# Patient Record
Sex: Male | Born: 1960
Health system: Southern US, Community
[De-identification: ages and names within clinical notes are randomized; demographics above are authoritative.]

## PROBLEM LIST (undated history)

## (undated) DIAGNOSIS — E7800 Pure hypercholesterolemia, unspecified: Secondary | ICD-10-CM

## (undated) DIAGNOSIS — T8484XA Pain due to internal orthopedic prosthetic devices, implants and grafts, initial encounter: Secondary | ICD-10-CM

## (undated) DIAGNOSIS — I10 Essential (primary) hypertension: Secondary | ICD-10-CM

## (undated) DIAGNOSIS — G4730 Sleep apnea, unspecified: Secondary | ICD-10-CM

## (undated) HISTORY — PX: VASECTOMY: SHX75

## (undated) HISTORY — DX: Sleep apnea, unspecified: G47.30

## (undated) HISTORY — PX: ADENOIDECTOMY: SUR15

## (undated) HISTORY — DX: Essential (primary) hypertension: I10

---

## 2000-06-12 ENCOUNTER — Emergency Department (HOSPITAL_COMMUNITY): Admission: EM | Admit: 2000-06-12 | Discharge: 2000-06-12 | Payer: Self-pay | Admitting: Emergency Medicine

## 2014-09-23 ENCOUNTER — Other Ambulatory Visit: Payer: Self-pay | Admitting: Otolaryngology

## 2014-09-23 DIAGNOSIS — R220 Localized swelling, mass and lump, head: Secondary | ICD-10-CM

## 2014-09-23 DIAGNOSIS — R221 Localized swelling, mass and lump, neck: Secondary | ICD-10-CM

## 2014-09-23 DIAGNOSIS — K1121 Acute sialoadenitis: Secondary | ICD-10-CM

## 2014-10-01 ENCOUNTER — Ambulatory Visit
Admission: RE | Admit: 2014-10-01 | Discharge: 2014-10-01 | Disposition: A | Discharge status: Home / Self Care | Payer: BLUE CROSS/BLUE SHIELD | Source: Ambulatory Visit | Attending: Otolaryngology | Admitting: Otolaryngology

## 2014-10-01 DIAGNOSIS — K1121 Acute sialoadenitis: Secondary | ICD-10-CM

## 2014-10-01 DIAGNOSIS — R220 Localized swelling, mass and lump, head: Secondary | ICD-10-CM

## 2014-10-01 DIAGNOSIS — R221 Localized swelling, mass and lump, neck: Secondary | ICD-10-CM

## 2016-03-06 IMAGING — US US SOFT TISSUE HEAD/NECK
1 series · 14 of 20 positions shown · non-contrast
Comparison: None.

CLINICAL DATA: Left facial swelling 2 weeks ago

EXAM:
ULTRASOUND OF HEAD/NECK SOFT TISSUES
TECHNIQUE: Ultrasound examination of the head and neck soft tissues was
performed in the area of clinical concern.

[Series 1: us soft tissue head/neck · 0.06mm/px · 14 of 20 slices shown]
[im 1/20]
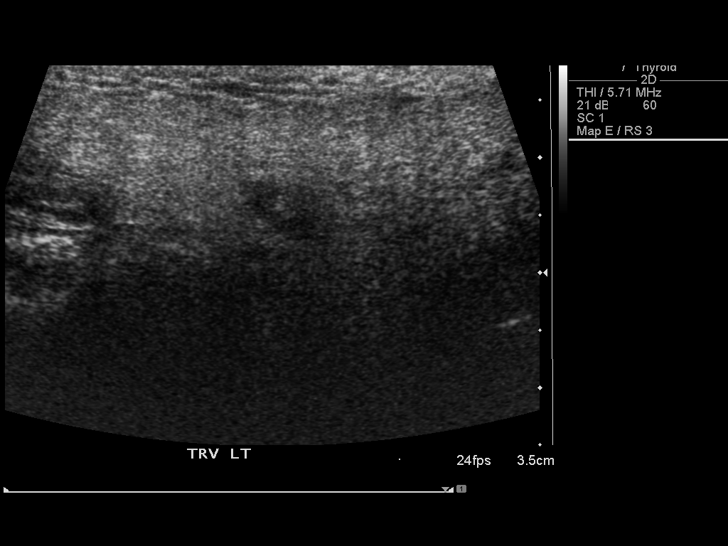
[im 3/20]
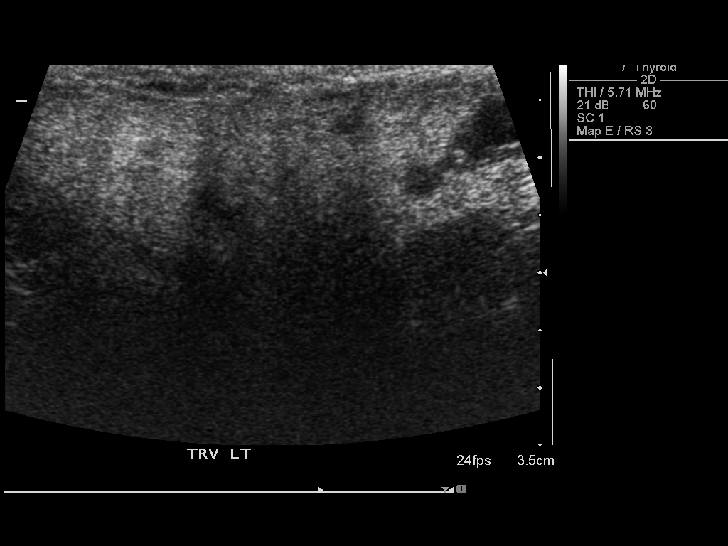
[im 4/20]
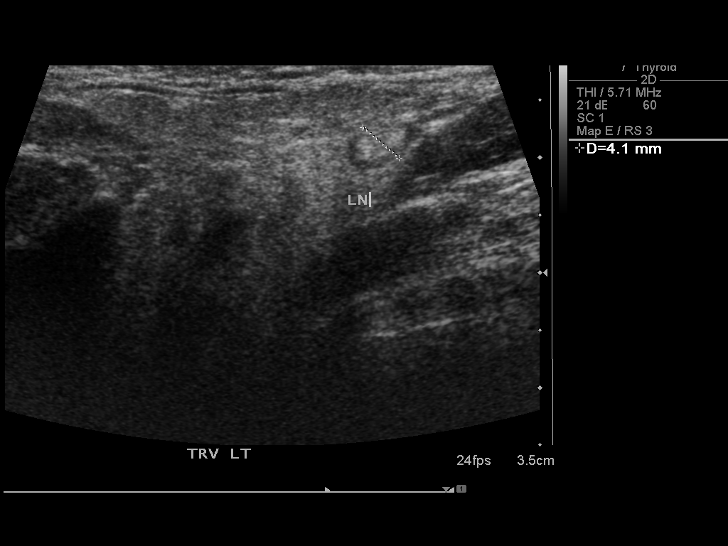
[im 6/20]
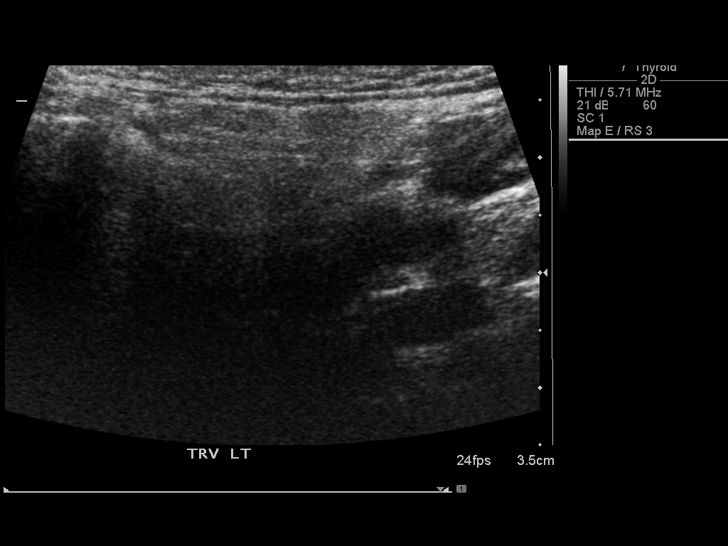
[im 7/20]
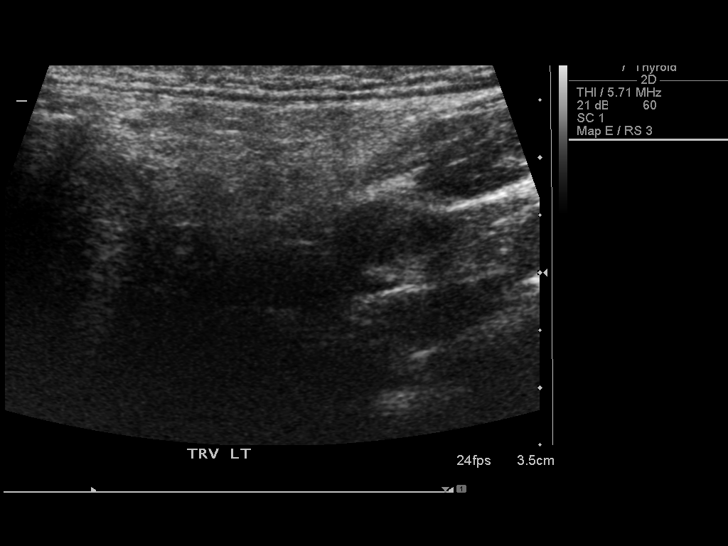
[im 8/20]
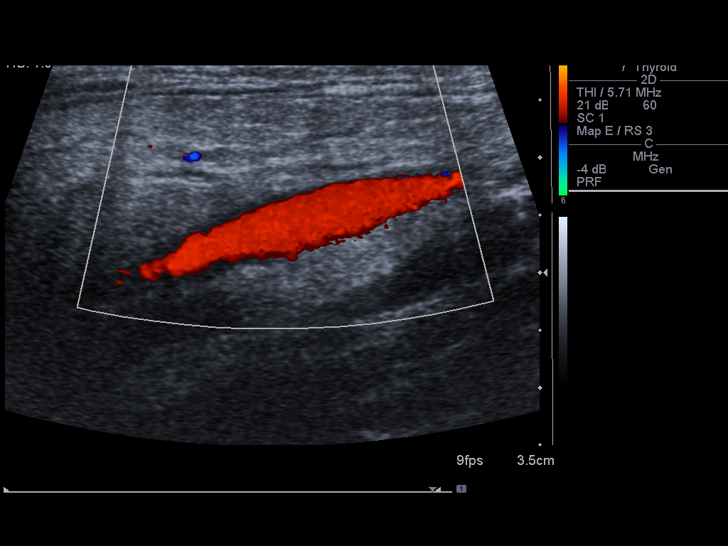
[im 10/20]
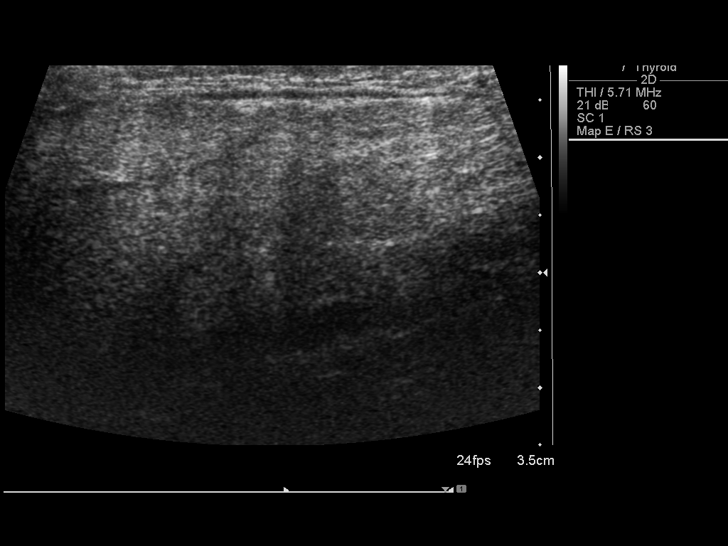
[im 11/20]
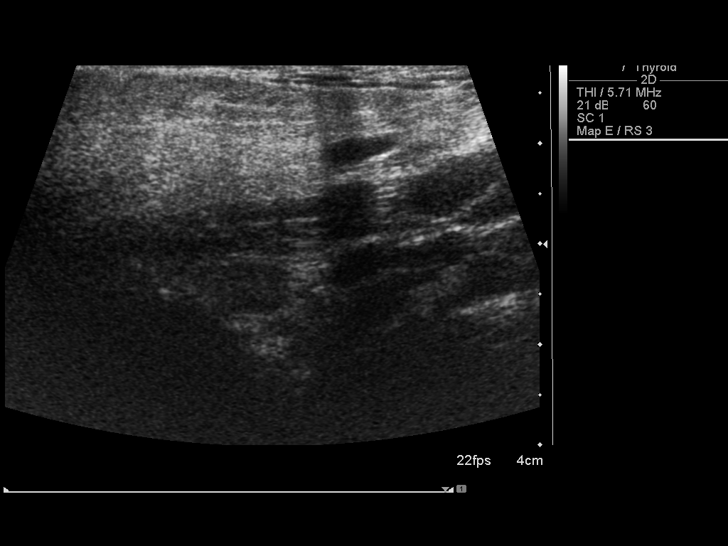
[im 13/20]
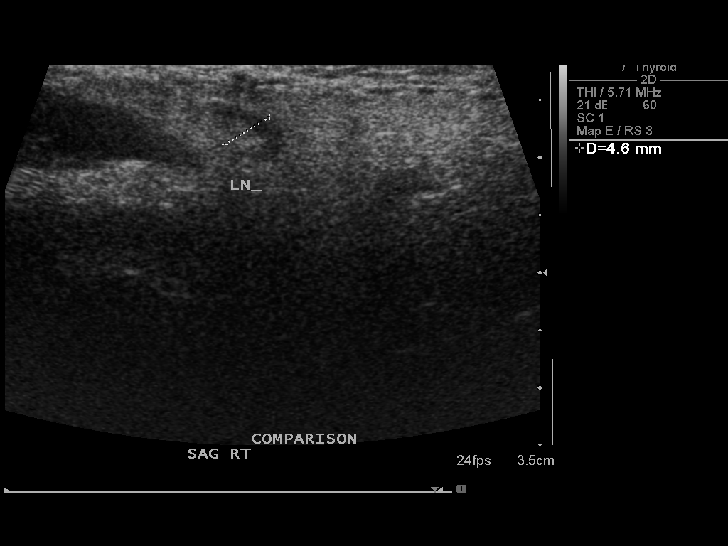
[im 14/20]
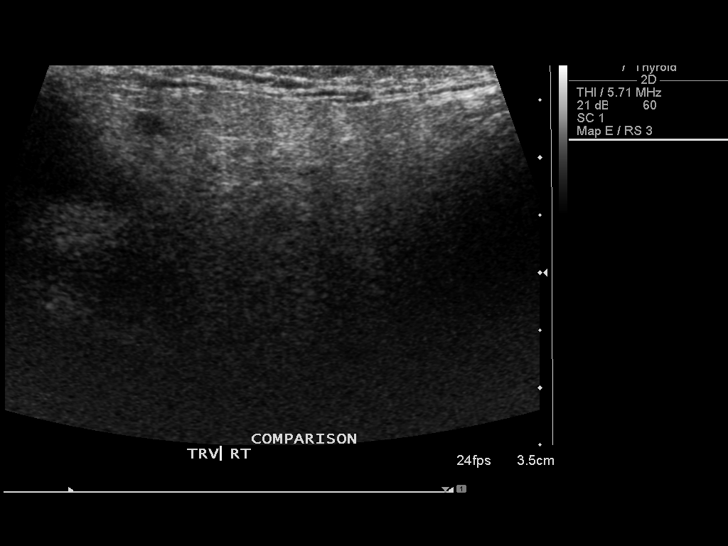
[im 16/20]
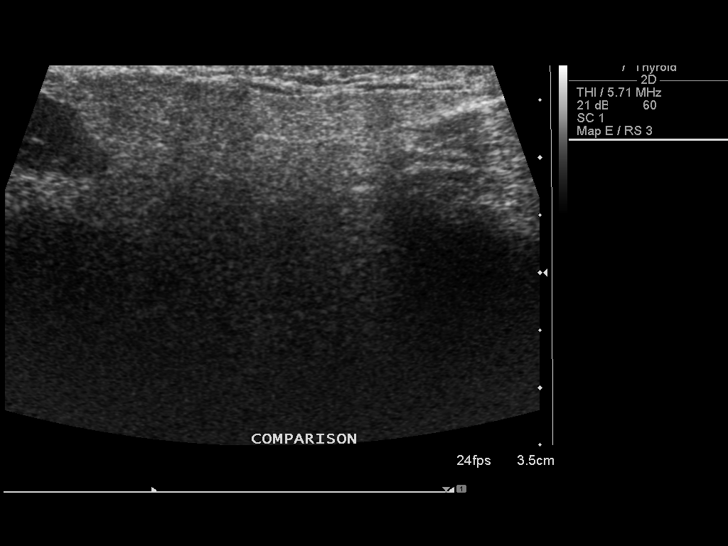
[im 17/20]
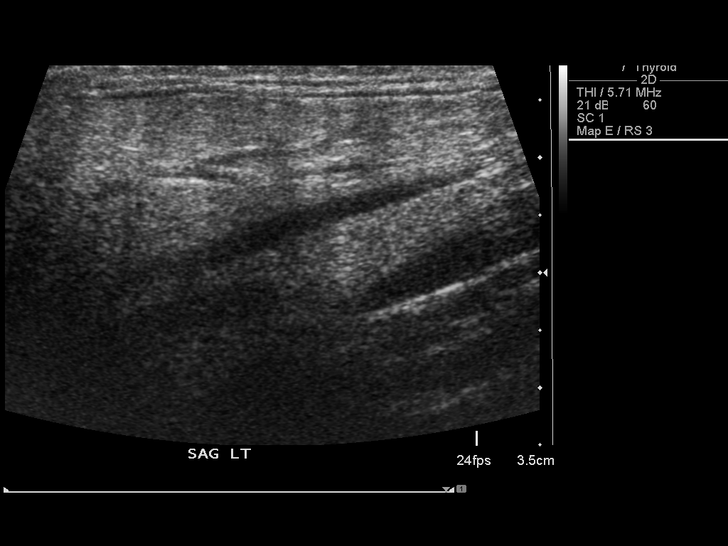
[im 18/20]
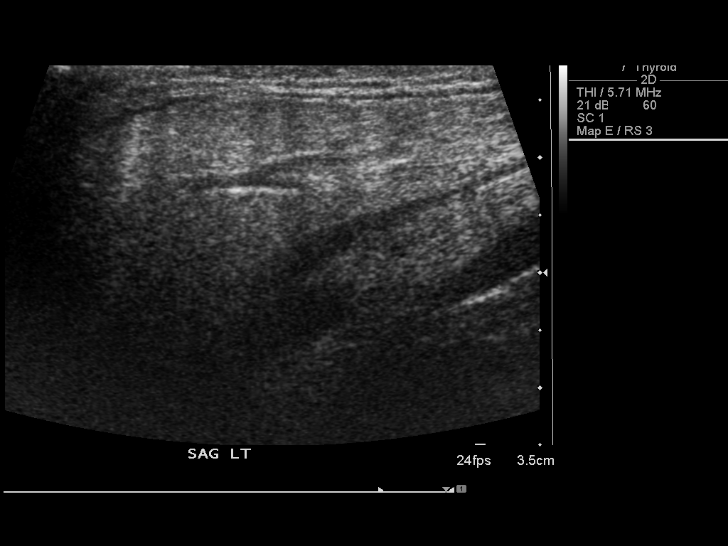
[im 20/20]
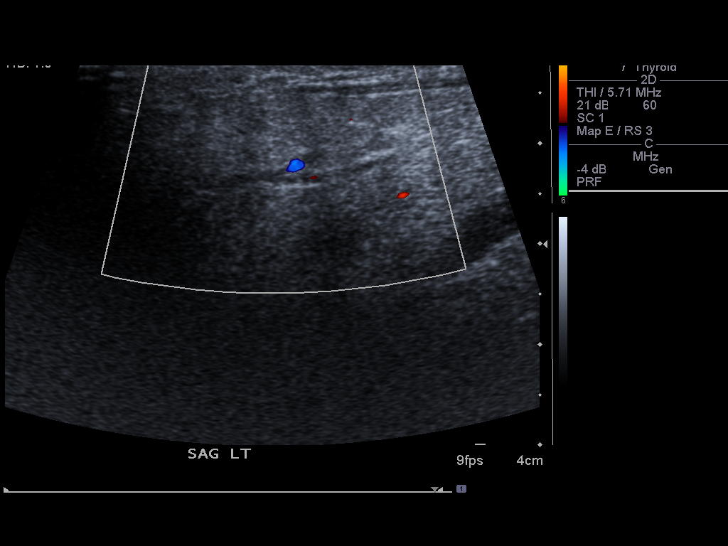

[14 of 20 positions shown; findings below may reference images not displayed]

FINDINGS: Parotid glands are unremarkable. No cyst, mass, or shadowing
calcification. No regional adenopathy identified.
IMPRESSION: 1. Negative

## 2016-03-28 ENCOUNTER — Emergency Department (HOSPITAL_COMMUNITY)
Admission: EM | Admit: 2016-03-28 | Discharge: 2016-03-28 | Disposition: A | Discharge status: Home / Self Care | Payer: BLUE CROSS/BLUE SHIELD | Attending: Emergency Medicine | Admitting: Emergency Medicine

## 2016-03-28 ENCOUNTER — Emergency Department (HOSPITAL_COMMUNITY): Payer: BLUE CROSS/BLUE SHIELD

## 2016-03-28 ENCOUNTER — Encounter (HOSPITAL_COMMUNITY): Payer: Self-pay | Admitting: Emergency Medicine

## 2016-03-28 DIAGNOSIS — S82851A Displaced trimalleolar fracture of right lower leg, initial encounter for closed fracture: Secondary | ICD-10-CM | POA: Diagnosis not present

## 2016-03-28 DIAGNOSIS — Y929 Unspecified place or not applicable: Secondary | ICD-10-CM | POA: Diagnosis not present

## 2016-03-28 DIAGNOSIS — S9304XA Dislocation of right ankle joint, initial encounter: Secondary | ICD-10-CM | POA: Diagnosis not present

## 2016-03-28 DIAGNOSIS — Y999 Unspecified external cause status: Secondary | ICD-10-CM | POA: Insufficient documentation

## 2016-03-28 DIAGNOSIS — Y9366 Activity, soccer: Secondary | ICD-10-CM | POA: Insufficient documentation

## 2016-03-28 DIAGNOSIS — S99911A Unspecified injury of right ankle, initial encounter: Secondary | ICD-10-CM | POA: Diagnosis present

## 2016-03-28 DIAGNOSIS — X501XXA Overexertion from prolonged static or awkward postures, initial encounter: Secondary | ICD-10-CM | POA: Diagnosis not present

## 2016-03-28 HISTORY — DX: Pure hypercholesterolemia, unspecified: E78.00

## 2016-03-28 MED ORDER — PROPOFOL 10 MG/ML IV BOLUS
INTRAVENOUS | Status: AC | PRN
  Administered 2016-03-28: 20 mg via INTRAVENOUS
  Administered 2016-03-28: 40 mg via INTRAVENOUS
  Administered 2016-03-28: 20 mg via INTRAVENOUS

## 2016-03-28 MED ORDER — HYDROCODONE-ACETAMINOPHEN 5-325 MG PO TABS: 2.0000 | ORAL_TABLET | Freq: Every evening | ORAL | 0 refills | Status: DC | PRN

## 2016-03-28 MED ORDER — PROPOFOL 10 MG/ML IV BOLUS
0.5000 mg/kg | Freq: Once | INTRAVENOUS | Status: DC
  Filled 2016-03-28: qty 20

## 2016-03-28 MED ORDER — KETOROLAC TROMETHAMINE 15 MG/ML IJ SOLN
15.0000 mg | Freq: Once | INTRAMUSCULAR | Status: AC
  Administered 2016-03-28: 15 mg via INTRAVENOUS
  Filled 2016-03-28: qty 1

## 2016-03-28 NOTE — ED Notes (Signed)
Patient being transported to x-ray now.

## 2016-03-28 NOTE — Sedation Documentation (Signed)
Dr. Shon BatonBrooks successfully reduced patient's R ankle. Patient remains alert. Ortho tech splinting R leg now.

## 2016-03-28 NOTE — ED Notes (Signed)
Portable at the bedside.  

## 2016-03-28 NOTE — ED Notes (Signed)
PA-C at bedside 

## 2016-03-28 NOTE — Progress Notes (Signed)
Orthopedic Tech Progress Note Patient Details:  Vanessa BarbaraJonathan Moravek 23-Feb-1961 [id number]  Ortho Devices Type of Ortho Device: Ace wrap, Crutches, Post (short leg) splint, Stirrup splint Ortho Device/Splint Location: RLE Ortho Device/Splint Interventions: Ordered, Application   Jennye MoccasinHughes, Teodor Prater Craig 03/28/2016, 10:12 PM

## 2016-03-28 NOTE — ED Notes (Signed)
Patient A&O x 4, aldrete score 10. Patient given instructions on use of crutches/care of splint, follow-up instructions with orthopedic surgeon, and prescription medication. Patient verbalized understanding of discharge instructions and denies any further needs or questions at this time. VS stable. Escorted patient to ED entrance in wheelchair.

## 2016-03-28 NOTE — Sedation Documentation (Addendum)
CO2 and oxygen hooked up to patient. Ambu bag and suction set up at the bedside.

## 2016-03-28 NOTE — ED Triage Notes (Signed)
PER [facility]: Patient to ED from soccer field c/o ankle pain. Per EMS, patient went up for a header and landed on his R ankle wrong, heard it snap. EMS has splint on it at this time. CMS intact distal to injury. Ankle is forward and to the right. EMS VS: 157/92, HR 90, RR 18. Patient A&O x 4, no other injuries. Is refusing pain medication because he "just doesn't want any."

## 2016-03-28 NOTE — ED Provider Notes (Addendum)
[facility]-EMERGENCY DEPT Provider Note   CSN: [id number] Arrival date & time: 03/28/16  2004  First Provider Contact:  None       History   Chief Complaint Chief Complaint  Patient presents with  . Ankle Pain    HPI Matthew Smith is a 55 y.o. male.  HPI   Patient is a 55 year old male who presents the ED with right ankle pain and deformity roughly 30 minutes prior to arrival. Patient states his play soccer and was hit from the side and landed on his ankle. He said he heard a crunch and experienced sharp pain. His pain currently is nonradiating, 5/10. He is not taking anything for pain. He denies numbness/tingling, nausea, vomiting, chest pain, shortness breath.  Past Medical History:  Diagnosis Date  . Hypercholesterolemia     There are no active problems to display for this patient.   Past Surgical History:  Procedure Laterality Date  . VASECTOMY         Home Medications    Prior to Admission medications   Medication Sig Start Date End Date Taking? Authorizing Provider  HYDROcodone-acetaminophen (NORCO/VICODIN) 5-325 MG tablet Take 2 tablets by mouth at bedtime as needed. 03/28/16   Jerre Simon, PA    Family History Family History  Problem Relation Age of Onset  . Cancer Mother   . Heart attack Father     Social History Social History  Substance Use Topics  . Smoking status: Never Smoker  . Smokeless tobacco: Never Used  . Alcohol use Yes     Comment: daily, 2-3 drinks a day, "not to get drunk"     Allergies   Review of patient's allergies indicates no known allergies.   Review of Systems Review of Systems  Constitutional: Negative for fever.  Respiratory: Negative for shortness of breath.   Cardiovascular: Negative for chest pain.  Gastrointestinal: Negative for nausea and vomiting.  Musculoskeletal: Positive for arthralgias and joint swelling.  Skin: Negative for rash and wound.     Physical Exam Updated Vital Signs BP 159/93   Pulse  63   Temp 99 F (37.2 C) (Oral)   Resp 18   Ht 5\' 11"  (1.803 m)   Wt 81.6 kg   SpO2 97%   BMI 25.10 kg/m   Physical Exam  Constitutional: He appears well-developed and well-nourished. No distress.  HENT:  Head: Normocephalic and atraumatic.  Eyes: Conjunctivae are normal.  Cardiovascular: Normal rate, regular rhythm and normal heart sounds.  Exam reveals no gallop and no friction rub.   No murmur heard. Pulmonary/Chest: Effort normal. No respiratory distress.  Musculoskeletal: Normal range of motion. He exhibits tenderness and deformity.  Examination of right lower extremity reveals obvious deformity of the anterior ankle, foot is pointed medially, mild ecchymosis and mild edema noted, full range motion of the knee and toes, no range of motion of ankle, deformity TTP, patient's neurovascularly intact distally  Neurological: He is alert. Coordination normal.  Skin: Skin is warm and dry. He is not diaphoretic.  Psychiatric: He has a normal mood and affect. His behavior is normal.  Nursing note and vitals reviewed.    ED Treatments / Results  Labs (all labs ordered are listed, but only abnormal results are displayed) Labs Reviewed - No data to display  EKG  EKG Interpretation None       Radiology Dg Ankle 2 Views Right  Result Date: 03/28/2016 CLINICAL DATA:  Fracture, postreduction. EXAM: RIGHT ANKLE - 2 VIEW COMPARISON:  Pre  reduction radiographs earlier this day at 2039 hour FINDINGS: Improved alignment of the distal fibular fracture in tiny avulsion from the medial malleolus. Probable posterior tibial tubercle fracture, minimally displaced. There is persistent widening of the medial and lateral clear space. Overlying cast material in place. IMPRESSION: Improved alignment of the ankle fractures post reduction. This is likely a trimalleolar fracture. Electronically Signed   By: Rubye Oaks M.D.   On: 03/28/2016 22:58   Dg Ankle Complete Right  Result Date:  03/28/2016 CLINICAL DATA:  Status post fall while playing soccer, with right ankle deformity. Initial encounter. EXAM: RIGHT ANKLE - COMPLETE 3+ VIEW COMPARISON:  None. FINDINGS: There is a comminuted fracture of the distal fibula, with shortening and superior and lateral displacement. There is also a small avulsion fracture from the distal tip of the tibia. There is associated lateral dislocation of the talus. There may be a posterior malleolar fragment, though this is difficult to fully assess. Surrounding soft tissue swelling is noted. No additional fractures are seen. IMPRESSION: 1. Comminuted fracture of the distal fibula, with shortening and superior and lateral displacement. Small avulsion fracture from the distal tip of the tibia. 2. Associated lateral dislocation of the talus. 3. Possible posterior malleolar fragment, difficult to fully assess. Electronically Signed   By: Roanna Raider M.D.   On: 03/28/2016 21:31    Procedures Procedures (including critical care time)  Medications Ordered in ED Medications  propofol (DIPRIVAN) 10 mg/mL bolus/IV push 40.8 mg (not administered)  ketorolac (TORADOL) 15 MG/ML injection 15 mg (15 mg Intravenous Given 03/28/16 2129)  propofol (DIPRIVAN) 10 mg/mL bolus/IV push (20 mg Intravenous Given 03/28/16 2159)     Initial Impression / Assessment and Plan / ED Course  I have reviewed the triage vital signs and the nursing notes.  Pertinent labs & imaging results that were available during my care of the patient were reviewed by me and considered in my medical decision making (see chart for details).  Clinical Course   Pt with trimalleolar fracture dislocation. Dr. Shon Baton was consulted.   Dr. Judd Lien preformed conscious sedation for Dr. Shon Baton to reduce ankle dislocation. Pt was splinted and given toradol IV for pain.  Pt will be d/c with pain medication and f/u with Dr. Victorino Dike this week. Discussed no weight bearing, ice and elevation. Discussed strict return  precautions. Pt expressed understanding to the discharge instructions.  Pt case discussed and pt seen by Dr. Judd Lien who agrees with the above plan.    Final Clinical Impressions(s) / ED Diagnoses   Final diagnoses:  Trimalleolar fracture of ankle, closed, right, initial encounter  Ankle dislocation, right, initial encounter    New Prescriptions Discharge Medication List as of 03/28/2016 10:33 PM    START taking these medications   Details  HYDROcodone-acetaminophen (NORCO/VICODIN) 5-325 MG tablet Take 2 tablets by mouth at bedtime as needed., Starting Sun 03/28/2016, Print         Joyce Copa [place], PA 03/28/16 2337    Geoffery Lyons, MD 03/29/16 [phone]   Preprocedure  Pre-anesthesia/induction confirmation of laterality/correct procedure site including "time-out."  Provider confirms review of the nurses' note, allergies, medications, pertinent labs, PMH, pre-induction vital signs, pulse oximetry, pain level, and ECG (as applicable), and patient condition satisfactory for commencing with order for sedation and procedure.  Patient was given appropriate doses of propofol to achieve adequate sedation. The ankle was reduced by Dr. Shon Baton and splinted. Patient tolerated this well with no desaturations and an intact respiratory drive. He will  copy with no complications.    Geoffery Lyonsouglas Lenoria Narine, MD 04/05/16 [phone]

## 2016-03-28 NOTE — Consult Note (Signed)
Romie JumperBarden, Lucy P, PA-C Chief Complaint: S/p fall while playing soccer History: Patient is a 55 year old male who presents the ED with right ankle pain and deformity roughly 30 minutes prior to arrival. Patient states his play soccer and was hit from the side and landed on his ankle. He said he heard a crunch and experienced sharp pain. His pain currently is nonradiating, 5/10. He is not taking anything for pain. He denies numbness/tingling, nausea, vomiting, chest pain, shortness breath.  Past Medical History:  Diagnosis Date  . Hypercholesterolemia     No Known Allergies  No current facility-administered medications on file prior to encounter.    No current outpatient prescriptions on file prior to encounter.    Physical Exam: Vitals:   03/28/16 2045 03/28/16 2115  BP: 170/89 138/80  Pulse: (!) 59 (!) 58  Resp:    Temp:     A+O X3 No sob/cp abd soft/nt Compartments soft/NT 2+ DP/PT pulses Sensation to LT intact Obvious deformity of right ankle Skin tented but intact  Image: Dg Ankle Complete Right  Result Date: 03/28/2016 CLINICAL DATA:  Status post fall while playing soccer, with right ankle deformity. Initial encounter. EXAM: RIGHT ANKLE - COMPLETE 3+ VIEW COMPARISON:  None. FINDINGS: There is a comminuted fracture of the distal fibula, with shortening and superior and lateral displacement. There is also a small avulsion fracture from the distal tip of the tibia. There is associated lateral dislocation of the talus. There may be a posterior malleolar fragment, though this is difficult to fully assess. Surrounding soft tissue swelling is noted. No additional fractures are seen. IMPRESSION: 1. Comminuted fracture of the distal fibula, with shortening and superior and lateral displacement. Small avulsion fracture from the distal tip of the tibia. 2. Associated lateral dislocation of the talus. 3. Possible posterior malleolar fragment, difficult to fully assess. Electronically  Signed   By: Roanna RaiderJeffery  Chang M.D.   On: 03/28/2016 21:31    A/P: Otherwise healthy 55 yr old gentlemen who injured himself tonight playing soccer.  Xrays confirm bimalleolar ankle fracture/dislocation Plan: will reduce under sedation and splint. F/u this week with my partner Dr Victorino DikeHewitt for definitive fracture management Ice/elevate/NWB with crutches  Explained to patient and son - all questions addressed   Procedure: Consent for sedation obtained After adequate sedation obtained gentle closed reduction performed Splint applied xrays post-reduction pending Tolerated sedation without issue

## 2016-03-28 NOTE — Discharge Instructions (Signed)
Take the Norco at night as needed for pain. Call Dr. Victorino DikeHewitt to schedule an appointment to be seen this week. DO NOT bear weight on your leg. Keep foot elevated and use ice, 20 minutes on and 20 minutes off.   Return to the emergency department if you experience increased pain, swelling, blueness in your toes, numbness, fever, or any other concerning symptoms.

## 2016-03-31 ENCOUNTER — Other Ambulatory Visit: Payer: Self-pay | Admitting: Orthopedic Surgery

## 2016-04-01 ENCOUNTER — Encounter (HOSPITAL_BASED_OUTPATIENT_CLINIC_OR_DEPARTMENT_OTHER): Payer: Self-pay | Admitting: *Deleted

## 2016-04-01 ENCOUNTER — Encounter (HOSPITAL_BASED_OUTPATIENT_CLINIC_OR_DEPARTMENT_OTHER)
Admission: RE | Disposition: A | Discharge status: Home / Self Care | Payer: Self-pay | Source: Ambulatory Visit | Attending: Orthopedic Surgery

## 2016-04-01 ENCOUNTER — Ambulatory Visit (HOSPITAL_BASED_OUTPATIENT_CLINIC_OR_DEPARTMENT_OTHER)
Admission: RE | Admit: 2016-04-01 | Discharge: 2016-04-01 | Disposition: A | Discharge status: Home / Self Care | Payer: BLUE CROSS/BLUE SHIELD | Source: Ambulatory Visit | Attending: Orthopedic Surgery | Admitting: Orthopedic Surgery

## 2016-04-01 ENCOUNTER — Ambulatory Visit (HOSPITAL_BASED_OUTPATIENT_CLINIC_OR_DEPARTMENT_OTHER): Payer: BLUE CROSS/BLUE SHIELD | Admitting: Certified Registered"

## 2016-04-01 DIAGNOSIS — S93421A Sprain of deltoid ligament of right ankle, initial encounter: Secondary | ICD-10-CM | POA: Diagnosis not present

## 2016-04-01 DIAGNOSIS — X58XXXA Exposure to other specified factors, initial encounter: Secondary | ICD-10-CM | POA: Diagnosis not present

## 2016-04-01 DIAGNOSIS — S82851A Displaced trimalleolar fracture of right lower leg, initial encounter for closed fracture: Secondary | ICD-10-CM | POA: Diagnosis not present

## 2016-04-01 DIAGNOSIS — E7800 Pure hypercholesterolemia, unspecified: Secondary | ICD-10-CM | POA: Insufficient documentation

## 2016-04-01 HISTORY — PX: ORIF ANKLE FRACTURE: SHX5408

## 2016-04-01 HISTORY — PX: LIGAMENT REPAIR: SHX5444

## 2016-04-01 SURGERY — OPEN REDUCTION INTERNAL FIXATION (ORIF) ANKLE FRACTURE
Anesthesia: General | Site: Ankle | Laterality: Right

## 2016-04-01 MED ORDER — OXYCODONE HCL 5 MG PO TABS: 5.0000 mg | ORAL_TABLET | Freq: Once | ORAL | Status: DC | PRN

## 2016-04-01 MED ORDER — CEFAZOLIN SODIUM-DEXTROSE 2-4 GM/100ML-% IV SOLN
2.0000 g | INTRAVENOUS | Status: AC
  Administered 2016-04-01: 2 g via INTRAVENOUS

## 2016-04-01 MED ORDER — LACTATED RINGERS IV SOLN
INTRAVENOUS | Status: DC
  Administered 2016-04-01 (×2): via INTRAVENOUS

## 2016-04-01 MED ORDER — LIDOCAINE 2% (20 MG/ML) 5 ML SYRINGE
INTRAMUSCULAR | Status: DC | PRN
  Administered 2016-04-01: 20 mg via INTRAVENOUS

## 2016-04-01 MED ORDER — ASPIRIN EC 81 MG PO TBEC: 81.0000 mg | DELAYED_RELEASE_TABLET | Freq: Two times a day (BID) | ORAL | 0 refills | Status: DC

## 2016-04-01 MED ORDER — ONDANSETRON HCL 4 MG/2ML IJ SOLN: 4.0000 mg | Freq: Once | INTRAMUSCULAR | Status: DC | PRN

## 2016-04-01 MED ORDER — OXYCODONE HCL 5 MG/5ML PO SOLN: 5.0000 mg | Freq: Once | ORAL | Status: DC | PRN

## 2016-04-01 MED ORDER — FENTANYL CITRATE (PF) 100 MCG/2ML IJ SOLN
INTRAMUSCULAR | Status: AC
  Filled 2016-04-01: qty 2

## 2016-04-01 MED ORDER — CEFAZOLIN SODIUM-DEXTROSE 2-4 GM/100ML-% IV SOLN
INTRAVENOUS | Status: AC
  Filled 2016-04-01: qty 100

## 2016-04-01 MED ORDER — LACTATED RINGERS IV SOLN: 500.0000 mL | INTRAVENOUS | Status: DC

## 2016-04-01 MED ORDER — ROPIVACAINE HCL 5 MG/ML IJ SOLN
INTRAMUSCULAR | Status: DC | PRN
  Administered 2016-04-01 (×2): 10 mL via PERINEURAL

## 2016-04-01 MED ORDER — MIDAZOLAM HCL 2 MG/2ML IJ SOLN
INTRAMUSCULAR | Status: AC
  Filled 2016-04-01: qty 2

## 2016-04-01 MED ORDER — 0.9 % SODIUM CHLORIDE (POUR BTL) OPTIME
TOPICAL | Status: DC | PRN
  Administered 2016-04-01: 200 mL

## 2016-04-01 MED ORDER — SODIUM CHLORIDE 0.9 % IV SOLN: INTRAVENOUS | Status: DC

## 2016-04-01 MED ORDER — DEXAMETHASONE SODIUM PHOSPHATE 10 MG/ML IJ SOLN
INTRAMUSCULAR | Status: DC | PRN
  Administered 2016-04-01: 10 mg via INTRAVENOUS

## 2016-04-01 MED ORDER — FENTANYL CITRATE (PF) 100 MCG/2ML IJ SOLN
50.0000 ug | INTRAMUSCULAR | Status: DC | PRN
  Administered 2016-04-01: 50 ug via INTRAVENOUS
  Administered 2016-04-01: 100 ug via INTRAVENOUS

## 2016-04-01 MED ORDER — DEXAMETHASONE SODIUM PHOSPHATE 10 MG/ML IJ SOLN
INTRAMUSCULAR | Status: AC
  Filled 2016-04-01: qty 1

## 2016-04-01 MED ORDER — SCOPOLAMINE 1 MG/3DAYS TD PT72: 1.0000 | MEDICATED_PATCH | Freq: Once | TRANSDERMAL | Status: DC | PRN

## 2016-04-01 MED ORDER — HYDROMORPHONE HCL 1 MG/ML IJ SOLN: 0.2500 mg | INTRAMUSCULAR | Status: DC | PRN

## 2016-04-01 MED ORDER — MEPERIDINE HCL 25 MG/ML IJ SOLN: 6.2500 mg | INTRAMUSCULAR | Status: DC | PRN

## 2016-04-01 MED ORDER — BUPIVACAINE-EPINEPHRINE (PF) 0.5% -1:200000 IJ SOLN
INTRAMUSCULAR | Status: DC | PRN
  Administered 2016-04-01 (×2): 10 mL via PERINEURAL

## 2016-04-01 MED ORDER — GLYCOPYRROLATE 0.2 MG/ML IJ SOLN: 0.2000 mg | Freq: Once | INTRAMUSCULAR | Status: DC | PRN

## 2016-04-01 MED ORDER — PROPOFOL 10 MG/ML IV BOLUS
INTRAVENOUS | Status: DC | PRN
  Administered 2016-04-01: 200 mg via INTRAVENOUS

## 2016-04-01 MED ORDER — ONDANSETRON HCL 4 MG/2ML IJ SOLN
INTRAMUSCULAR | Status: DC | PRN
  Administered 2016-04-01: 4 mg via INTRAVENOUS

## 2016-04-01 MED ORDER — ONDANSETRON HCL 4 MG/2ML IJ SOLN
INTRAMUSCULAR | Status: AC
Start: 2016-04-01 — End: 2016-04-01
  Filled 2016-04-01: qty 2

## 2016-04-01 MED ORDER — LIDOCAINE 2% (20 MG/ML) 5 ML SYRINGE
INTRAMUSCULAR | Status: AC
  Filled 2016-04-01: qty 5

## 2016-04-01 MED ORDER — CHLORHEXIDINE GLUCONATE 4 % EX LIQD: 60.0000 mL | Freq: Once | CUTANEOUS | Status: DC

## 2016-04-01 MED ORDER — OXYCODONE HCL 5 MG PO TABS: 5.0000 mg | ORAL_TABLET | ORAL | 0 refills | Status: DC | PRN

## 2016-04-01 MED ORDER — MIDAZOLAM HCL 2 MG/2ML IJ SOLN
1.0000 mg | INTRAMUSCULAR | Status: DC | PRN
  Administered 2016-04-01: 2 mg via INTRAVENOUS

## 2016-04-01 SURGICAL SUPPLY — 79 items
ANCH SUT 1 SHRT SM RGD INSRTR (Anchor) ×1 IMPLANT
ANCHOR SUT 1.45 SZ 1 SHORT (Anchor) ×2 IMPLANT
BANDAGE ESMARK 6X9 LF (GAUZE/BANDAGES/DRESSINGS) ×1 IMPLANT
BIT DRILL 2.5X2.75 QC CALB (BIT) ×4 IMPLANT
BIT DRILL 3.5X5.5 QC CALB (BIT) ×2 IMPLANT
BLADE SURG 15 STRL LF DISP TIS (BLADE) ×2 IMPLANT
BLADE SURG 15 STRL SS (BLADE) ×6
BNDG CMPR 9X4 STRL LF SNTH (GAUZE/BANDAGES/DRESSINGS)
BNDG CMPR 9X6 STRL LF SNTH (GAUZE/BANDAGES/DRESSINGS) ×1
BNDG COHESIVE 4X5 TAN STRL (GAUZE/BANDAGES/DRESSINGS) ×3 IMPLANT
BNDG COHESIVE 6X5 TAN STRL LF (GAUZE/BANDAGES/DRESSINGS) ×3 IMPLANT
BNDG ESMARK 4X9 LF (GAUZE/BANDAGES/DRESSINGS) IMPLANT
BNDG ESMARK 6X9 LF (GAUZE/BANDAGES/DRESSINGS) ×3
CANISTER SUCT 1200ML W/VALVE (MISCELLANEOUS) ×3 IMPLANT
CHLORAPREP W/TINT 26ML (MISCELLANEOUS) ×3 IMPLANT
COVER BACK TABLE 60X90IN (DRAPES) ×3 IMPLANT
CUFF TOURNIQUET SINGLE 34IN LL (TOURNIQUET CUFF) ×2 IMPLANT
DECANTER SPIKE VIAL GLASS SM (MISCELLANEOUS) IMPLANT
DRAPE EXTREMITY T 121X128X90 (DRAPE) ×3 IMPLANT
DRAPE OEC MINIVIEW 54X84 (DRAPES) ×3 IMPLANT
DRAPE U-SHAPE 47X51 STRL (DRAPES) ×3 IMPLANT
DRSG MEPITEL 4X7.2 (GAUZE/BANDAGES/DRESSINGS) ×3 IMPLANT
DRSG PAD ABDOMINAL 8X10 ST (GAUZE/BANDAGES/DRESSINGS) ×6 IMPLANT
ELECT REM PT RETURN 9FT ADLT (ELECTROSURGICAL) ×3
ELECTRODE REM PT RTRN 9FT ADLT (ELECTROSURGICAL) ×1 IMPLANT
GAUZE SPONGE 4X4 12PLY STRL (GAUZE/BANDAGES/DRESSINGS) ×3 IMPLANT
GLOVE BIO SURGEON STRL SZ8 (GLOVE) ×3 IMPLANT
GLOVE BIOGEL PI IND STRL 7.0 (GLOVE) IMPLANT
GLOVE BIOGEL PI IND STRL 8 (GLOVE) ×2 IMPLANT
GLOVE BIOGEL PI INDICATOR 7.0 (GLOVE) ×4
GLOVE BIOGEL PI INDICATOR 8 (GLOVE) ×4
GLOVE ECLIPSE 6.5 STRL STRAW (GLOVE) ×2 IMPLANT
GLOVE ECLIPSE 7.5 STRL STRAW (GLOVE) ×3 IMPLANT
GLOVE EXAM NITRILE MD LF STRL (GLOVE) IMPLANT
GOWN STRL REUS W/ TWL LRG LVL3 (GOWN DISPOSABLE) ×1 IMPLANT
GOWN STRL REUS W/ TWL XL LVL3 (GOWN DISPOSABLE) ×2 IMPLANT
GOWN STRL REUS W/TWL LRG LVL3 (GOWN DISPOSABLE) ×3
GOWN STRL REUS W/TWL XL LVL3 (GOWN DISPOSABLE) ×6
NEEDLE HYPO 22GX1.5 SAFETY (NEEDLE) IMPLANT
NS IRRIG 1000ML POUR BTL (IV SOLUTION) ×3 IMPLANT
PACK BASIN DAY SURGERY FS (CUSTOM PROCEDURE TRAY) ×3 IMPLANT
PAD CAST 4YDX4 CTTN HI CHSV (CAST SUPPLIES) ×1 IMPLANT
PADDING CAST ABS 4INX4YD NS (CAST SUPPLIES)
PADDING CAST ABS COTTON 4X4 ST (CAST SUPPLIES) IMPLANT
PADDING CAST COTTON 4X4 STRL (CAST SUPPLIES) ×3
PADDING CAST COTTON 6X4 STRL (CAST SUPPLIES) ×3 IMPLANT
PENCIL BUTTON HOLSTER BLD 10FT (ELECTRODE) ×3 IMPLANT
PLATE ACE 100DEG 7HOLE (Plate) ×2 IMPLANT
SANITIZER HAND PURELL 535ML FO (MISCELLANEOUS) ×3 IMPLANT
SCREW CORTICAL 3.5MM  16MM (Screw) ×2 IMPLANT
SCREW CORTICAL 3.5MM  55MM (Screw) ×2 IMPLANT
SCREW CORTICAL 3.5MM  60MM (Screw) ×2 IMPLANT
SCREW CORTICAL 3.5MM 14MM (Screw) ×8 IMPLANT
SCREW CORTICAL 3.5MM 16MM (Screw) IMPLANT
SCREW CORTICAL 3.5MM 18MM (Screw) ×4 IMPLANT
SCREW CORTICAL 3.5MM 55MM (Screw) IMPLANT
SCREW CORTICAL 3.5MM 60MM (Screw) IMPLANT
SHEET MEDIUM DRAPE 40X70 STRL (DRAPES) ×3 IMPLANT
SLEEVE SCD COMPRESS KNEE MED (MISCELLANEOUS) ×3 IMPLANT
SPLINT FAST PLASTER 5X30 (CAST SUPPLIES) ×40
SPLINT PLASTER CAST FAST 5X30 (CAST SUPPLIES) ×20 IMPLANT
SPONGE LAP 18X18 X RAY DECT (DISPOSABLE) ×3 IMPLANT
STOCKINETTE 6  STRL (DRAPES) ×2
STOCKINETTE 6 STRL (DRAPES) ×1 IMPLANT
SUCTION FRAZIER HANDLE 10FR (MISCELLANEOUS) ×2
SUCTION TUBE FRAZIER 10FR DISP (MISCELLANEOUS) ×1 IMPLANT
SUT ETHILON 3 0 PS 1 (SUTURE) ×5 IMPLANT
SUT FIBERWIRE #2 38 T-5 BLUE (SUTURE)
SUT MNCRL AB 3-0 PS2 18 (SUTURE) ×4 IMPLANT
SUT VIC AB 0 SH 27 (SUTURE) ×2 IMPLANT
SUT VIC AB 2-0 SH 27 (SUTURE) ×3
SUT VIC AB 2-0 SH 27XBRD (SUTURE) ×1 IMPLANT
SUTURE FIBERWR #2 38 T-5 BLUE (SUTURE) IMPLANT
SYR BULB 3OZ (MISCELLANEOUS) ×3 IMPLANT
SYR CONTROL 10ML LL (SYRINGE) IMPLANT
TOWEL OR 17X24 6PK STRL BLUE (TOWEL DISPOSABLE) ×6 IMPLANT
TUBE CONNECTING 20'X1/4 (TUBING) ×1
TUBE CONNECTING 20X1/4 (TUBING) ×2 IMPLANT
UNDERPAD 30X30 (UNDERPADS AND DIAPERS) ×3 IMPLANT

## 2016-04-01 NOTE — Brief Op Note (Signed)
04/01/2016  3:32 PM  PATIENT:  Vanessa BarbaraJonathan Dercole  55 y.o. male  PRE-OPERATIVE DIAGNOSIS:  RIGHT ANKLE LATERAL MALLEOUS,POSTERIOR MALLEOUS FRACTURE,DELTOID MALIICOUS LIGAMENT RUPTURE  POST-OPERATIVE DIAGNOSIS: 1.  Right ankle trimalleolar fracture 2.  Right ankle syndesmosis disruption 3.  Right ankle deltoid ligament rupture  Procedure(s): 1.  ORIF right ankle trimalleolar fracture without fixation of the posterior lip 2.  ORIF right ankle syndesmosis disruption 3.  Stress exam of right ankle under fluoro 4.  3 view xrays of the right ankle  SURGEON:  Toni ArthursJohn Akshay Spang, MD  ASSISTANT: n/a  ANESTHESIA:   General, regional  EBL:  minimal   TOURNIQUET:   Total Tourniquet Time Documented: Thigh (Right) - 49 minutes Total: Thigh (Right) - 49 minutes  COMPLICATIONS:  None apparent  DISPOSITION:  Extubated, awake and stable to recovery.  DICTATION ID:  [id number]

## 2016-04-01 NOTE — Anesthesia Procedure Notes (Addendum)
Anesthesia Regional Block:  Popliteal block  Pre-Anesthetic Checklist: ,, timeout performed, Correct Patient, Correct Site, Correct Laterality, Correct Procedure, Correct Position, site marked, Risks and benefits discussed,  Surgical consent,  Pre-op evaluation,  At surgeon's request and post-op pain management  Laterality: Right and Lower  Prep: chloraprep       Needles:  Injection technique: Single-shot  Needle Type: Echogenic Needle     Needle Length: 9cm 9 cm Needle Gauge: 21 and 21 G    Additional Needles:  Procedures: ultrasound guided (picture in chart) Popliteal block Narrative:  Start time: 04/01/2016 12:36 PM End time: 04/01/2016 12:39 PM Injection made incrementally with aspirations every 5 mL.  Performed by: Personally  Anesthesiologist: Amilcar Reever      Right popliteal block image

## 2016-04-01 NOTE — Discharge Instructions (Addendum)
Matthew Arthurs, MD [facility]  Please read the following information regarding your care after surgery.  Medications  You only need a prescription for the narcotic pain medicine (ex. oxycodone, Percocet, NorcoRegional Anesthesia Blocks  1. Numbness or the inability to move the "blocked" extremity may last from 3-48 hours after placement. The length of time depends on the medication injected and your individual response to the medication. If the numbness is not going away after 48 hours, call your surgeon.  2. The extremity that is blocked will need to be protected until the numbness is gone and the  Strength has returned. Because you cannot feel it, you will need to take extra care to avoid injury. Because it may be weak, you may have difficulty moving it or using it. You may not know what position it is in without looking at it while the block is in effect.  3. For blocks in the legs and feet, returning to weight bearing and walking needs to be done carefully. You will need to wait until the numbness is entirely gone and the strength has returned. You should be able to move your leg and foot normally before you try and bear weight or walk. You will need someone to be with you when you first try to ensure you do not fall and possibly risk injury.  4. Bruising and tenderness at the needle site are common side effects and will resolve in a few days.  5. Persistent numbness or new problems with movement should be communicated to the surgeon or the [facility] [phone] [facility] [phone]).).  All of the other medicines listed below are available over the counter. X acetominophen (Tylenol) 650 mg every 4-6 hours as you need for minor pain X oxycodone as prescribed for moderate to severe pain X Aleve two pills twice a day for three days after surgery   Narcotic pain medicine (ex. oxycodone, Percocet, Vicodin) will cause constipation.  To prevent this  problem, take the following medicines while you are taking any pain medicine. X docusate sodium (Colace) 100 mg twice a day X senna (Senokot) 2 tablets twice a day     X To help prevent blood clots, take a baby aspirin (81 mg) twice a day for two weeks after surgery.  You should also get up every hour while you are awake to move around.    Weight Bearing ? Bear weight when you are able on your operated leg or foot. ? Bear weight only on the heel of your operated foot in the post-op shoe. X Do not bear any weight on the operated leg or foot.  Cast / Splint / Dressing X Keep your splint or cast clean and dry.  Dont put anything (coat hanger, pencil, etc) down inside of it.  If it gets damp, use a hair dryer on the cool setting to dry it.  If it gets soaked, call the office to schedule an appointment for a cast change. ? Remove your dressing 3 days after surgery and cover the incisions with dry dressings.    After your dressing, cast or splint is removed; you may shower, but do not soak or scrub the wound.  Allow the water to run over it, and then gently pat it dry.  Swelling It is normal for you to have swelling where you had surgery.  To reduce swelling and pain, keep your toes above your nose for at least 3 days after surgery.  It  may be necessary to keep your foot or leg elevated for several weeks.  If it hurts, it should be elevated.  Follow Up Call my office at [phone] when you are discharged from the hospital or surgery center to schedule an appointment to be seen two weeks after surgery.  Call my office at [phone] if you develop a fever >101.5 F, nausea, vomiting, bleeding from the surgical site or severe pain.      Post Anesthesia Home Care Instructions  Activity: Get plenty of rest for the remainder of the day. A responsible adult should stay with you for 24 hours following the procedure.  For the next 24 hours, DO NOT: -Drive a car -Advertising copywriterperate machinery -Drink  alcoholic beverages -Take any medication unless instructed by your physician -Make any legal decisions or sign important papers.  Meals: Start with liquid foods such as gelatin or soup. Progress to regular foods as tolerated. Avoid greasy, spicy, heavy foods. If nausea and/or vomiting occur, drink only clear liquids until the nausea and/or vomiting subsides. Call your physician if vomiting continues.  Special Instructions/Symptoms: Your throat may feel dry or sore from the anesthesia or the breathing tube placed in your throat during surgery. If this causes discomfort, gargle with warm salt water. The discomfort should disappear within 24 hours.  If you had a scopolamine patch placed behind your ear for the management of post- operative nausea and/or vomiting:  1. The medication in the patch is effective for 72 hours, after which it should be removed.  Wrap patch in a tissue and discard in the trash. Wash hands thoroughly with soap and water. 2. You may remove the patch earlier than 72 hours if you experience unpleasant side effects which may include dry mouth, dizziness or visual disturbances. 3. Avoid touching the patch. Wash your hands with soap and water after contact with the patch.

## 2016-04-01 NOTE — H&P (Signed)
Vanessa BarbaraJonathan Footman is an 55 y.o. male.   Chief Complaint: right ankle injury HPI: 55 y/o male with right ankle fracture dislocation while playing soccer last week.  He presents today for surgical treatment.  Past Medical History:  Diagnosis Date  . Hypercholesterolemia     Past Surgical History:  Procedure Laterality Date  . ADENOIDECTOMY    . VASECTOMY      Family History  Problem Relation Age of Onset  . Cancer Mother   . Heart attack Father    Social History:  reports that he has never smoked. He has never used smokeless tobacco. He reports that he drinks alcohol. He reports that he does not use drugs.  Allergies: No Known Allergies  Medications Prior to Admission  Medication Sig Dispense Refill  . HYDROcodone-acetaminophen (NORCO/VICODIN) 5-325 MG tablet Take 2 tablets by mouth at bedtime as needed. 10 tablet 0    No results found for this or any previous visit (from the past 48 hour(s)). No results found.  ROS  No recent f/c/n/v/wtloss  Blood pressure 117/68, pulse (!) 53, temperature 97.9 F (36.6 C), temperature source Oral, resp. rate 16, height 5\' 11"  (1.803 m), weight 86.2 kg (190 lb), SpO2 100 %. Physical Exam  wn wd male in nad.  A and o x 4.  Mood and affect normal.  EOMI.  resp unlabored.  R ankle with health skin.  Moderate ecchymosis and swelling.  Sens to LT intact dorsally and plantarly.  No lymphadenopathy.  5/5 strength in PF and DF of the toes.  Assessment/Plan Right ankle lateral and posterior malleolus fractures and deltoid ligament rupture - to OR for ORIF and repair of deltoid ligament.  The risks and benefits of the alternative treatment options have been discussed in detail.  The patient wishes to proceed with surgery and specifically understands risks of bleeding, infection, nerve damage, blood clots, need for additional surgery, amputation and death.   Toni ArthursHEWITT, Tonja Jezewski, MD 04/01/2016, 1:38 PM

## 2016-04-01 NOTE — Op Note (Signed)
NAMVanessa Barbara:  Smith, Matthew Smith              ACCOUNT NO.:  [id number]  MEDICAL RECORD NO.:  [id number]  LOCATION:                                 FACILITY:  PHYSICIAN:  Toni ArthursJohn Raylan Hanton, MD        DATE OF BIRTH:  21-Jul-1961  DATE OF PROCEDURE:  04/01/2016 DATE OF DISCHARGE:                              OPERATIVE REPORT   PREOPERATIVE DIAGNOSES: 1. Right ankle bimalleolar fracture (lateral and posterior malleolus). 2. Right ankle deltoid ligament rupture.  POSTOPERATIVE DIAGNOSES: 1. Right ankle trimalleolar fracture. 2. Right ankle syndesmosis disruption. 3. Right ankle deltoid ligament rupture.  PROCEDURE: 1. Open reduction and internal fixation of right ankle trimalleolar     fracture without fixation of posterior lip. 2. Open reduction and internal fixation of the right ankle syndesmosis     disruption. 3. Stress examination of right ankle under fluoroscopy. 4. AP, mortise, and lateral radiographs of the right ankle.  SURGEON:  Toni ArthursJohn Ramyah Pankowski, MD  ANESTHESIA:  General, regional.  ESTIMATED BLOOD LOSS:  Minimal.  TOURNIQUET TIME:  49 minutes at 250 mmHg.  COMPLICATIONS:  None apparent.  DISPOSITION:  Extubated, awake, and stable to recovery.  INDICATIONS FOR PROCEDURE:  The patient is a 55 year old male who injured his right ankle playing soccer this past weekend.  He sustained fracture dislocation of the right ankle that was reduced and splinted. He presents now for operative treatment of this unstable and displaced ankle injury.  He understands the risks and benefits of the alternative treatment options and elects surgical treatment.  He specifically understands risks of bleeding, infection, nerve damage, blood clots, need for additional surgery, continued pain, nonunion, posttraumatic arthritis, amputation, and death.  PROCEDURE IN DETAIL:  After preoperative consent was obtained and the correct operative site was identified, the patient was brought to the operating room,  placed supine on the operating table.  General anesthesia was induced.  Preoperative antibiotics were administered. Surgical time-out was taken.  Right lower extremity was prepped and draped in standard sterile fashion with tourniquet around the thigh. The extremity was exsanguinated and tourniquet was inflated to 250 mmHg. A longitudinal incision was then made over the lateral malleolus.  Sharp dissection was carried down through skin and subcutaneous tissue.  The fracture site was cleaned of all hematoma and reduced.  It was held with a tenaculum.  A 3.5-mm fully-threaded lag screw was inserted from anterior to posterior across the fracture site.  It was noted to have excellent purchase.  A 7-hole one-third tubular plate from the [employer] small frag set was then contoured to fit the lateral malleolus.  It was secured distally with 3 unicortical screws and proximally with 3 bicortical screws.  Stress examination of the ankle was then performed.  Mortise view was obtained.  Dorsiflexion on external rotation stress was applied.  There was significant gapping noted at the level of the syndesmosis as well as the medial clear space.  A [place] clamp was then applied through stab incision medially.  The syndesmosis was reduced and compressed appropriately holding the ankle in neutral dorsiflexion.  Two 3.5 mm fully-threaded screws were then inserted across all 4 cortices of the distal tibia and fibula.  Stress examination was then repeated and there was still significant gapping at the medial clear space.  A curvilinear incision was made over the medial malleolus and sharp dissection was carried down through skin and subcutaneous tissue.  Deep and superficial portions of the deltoid ligament were completely ruptured.  The medial malleolus was cleared of soft tissue anteriorly at the site of the avulsion.  A JuggerKnot anchor was inserted.  Deltoid ligament was then repaired on to the  medial malleolus with the suture anchor.  Superficial deltoid was then repaired with simple and figure-of- eight sutures of 0-Vicryl.  The wound was irrigated copiously.  Final radiographs showed appropriate position and length of all hardware and appropriate reduction of the 3 fractures.  Wounds were closed with Vicryl, Monocryl, and nylon.  Sterile dressings were applied followed by well-padded short-leg splint.  Tourniquet was released after application of dressings at 45 minutes.  The patient was awakened from anesthesia and transported to the recovery room in a stable condition.  FOLLOW UP PLAN:  The patient will be nonweightbearing on the right lower extremity.  He will follow up in the office in 2 weeks for suture removal and conversion to a short leg cast.  We will plan the hardware removal in 3 to 4 months.  RADIOGRAPHS:  AP, mortise, and lateral radiographs of the right ankle were obtained intraoperatively.  These show interval reduction and fixation of the trimalleolar ankle fracture and syndesmosis disruption. Hardware is appropriately positioned and of the appropriate length.  No other acute injuries are noted.     Toni Arthurs, MD     JH/MEDQ  D:  04/01/2016  T:  04/01/2016  Job:  [id number]

## 2016-04-01 NOTE — Anesthesia Procedure Notes (Addendum)
Anesthesia Regional Block:  Adductor canal block  Pre-Anesthetic Checklist: ,, timeout performed, Correct Patient, Correct Site, Correct Laterality, Correct Procedure, Correct Position, site marked, Risks and benefits discussed,  Surgical consent,  Pre-op evaluation,  At surgeon's request and post-op pain management  Laterality: Right and Lower  Prep: chloraprep       Needles:  Injection technique: Single-shot  Needle Type: Echogenic Needle     Needle Length: 9cm 9 cm Needle Gauge: 21 and 21 G    Additional Needles:  Procedures: ultrasound guided (picture in chart) Adductor canal block Narrative:  Start time: 04/01/2016 12:40 PM End time: 04/01/2016 12:44 PM Injection made incrementally with aspirations every 5 mL.  Performed by: Personally  Anesthesiologist: Angelos Wasco       Right saphenous block image

## 2016-04-01 NOTE — Anesthesia Procedure Notes (Signed)
Procedure Name: LMA Insertion Date/Time: 04/01/2016 2:10 PM Performed by: Burna CashONRAD, Bina Veenstra C Pre-anesthesia Checklist: Patient identified, Emergency Drugs available, Suction available and Patient being monitored Patient Re-evaluated:Patient Re-evaluated prior to inductionOxygen Delivery Method: Circle system utilized Preoxygenation: Pre-oxygenation with 100% oxygen Intubation Type: IV induction Ventilation: Mask ventilation without difficulty LMA: LMA inserted LMA Size: 5.0 Number of attempts: 1 Airway Equipment and Method: Bite block Placement Confirmation: positive ETCO2 Tube secured with: Tape Dental Injury: Teeth and Oropharynx as per pre-operative assessment

## 2016-04-01 NOTE — Anesthesia Preprocedure Evaluation (Signed)
Anesthesia Evaluation  Patient identified by MRN, date of birth, ID band Patient awake    Reviewed: Allergy & Precautions, NPO status , Patient's Chart, lab work & pertinent test results  Airway Mallampati: I  TM Distance: >3 FB     Dental  (+) Teeth Intact, Dental Advisory Given   Pulmonary    breath sounds clear to auscultation       Cardiovascular  Rhythm:Regular Rate:Normal     Neuro/Psych    GI/Hepatic   Endo/Other    Renal/GU      Musculoskeletal   Abdominal   Peds  Hematology   Anesthesia Other Findings   Reproductive/Obstetrics                             Anesthesia Physical Anesthesia Plan  ASA: I  Anesthesia Plan: General   Post-op Pain Management: GA combined w/ Regional for post-op pain   Induction: Intravenous  Airway Management Planned: LMA  Additional Equipment:   Intra-op Plan:   Post-operative Plan: Extubation in OR  Informed Consent: I have reviewed the patients History and Physical, chart, labs and discussed the procedure including the risks, benefits and alternatives for the proposed anesthesia with the patient or authorized representative who has indicated his/her understanding and acceptance.   Dental advisory given  Plan Discussed with: CRNA, Anesthesiologist and Surgeon  Anesthesia Plan Comments:         Anesthesia Quick Evaluation

## 2016-04-01 NOTE — Transfer of Care (Signed)
Immediate Anesthesia Transfer of Care Note  Patient: Matthew BarbaraJonathan Smith  Procedure(s) Performed: Procedure(s): OPEN REDUCTION INTERNAL FIXATION (ORIF) TRIMALLEOLAR ANKLE FRACTURE AND SYNDESMOSIS  AND DELTOID LIGAMENT REPAIR (Right)  Patient Location: PACU  Anesthesia Type:GA combined with regional for post-op pain  Level of Consciousness: awake, alert  and oriented  Airway & Oxygen Therapy: Patient Spontanous Breathing and Patient connected to face mask oxygen  Post-op Assessment: Report given to RN and Post -op Vital signs reviewed and stable  Post vital signs: Reviewed and stable  Last Vitals:  Vitals:   04/01/16 1315 04/01/16 1330  BP: 114/68 117/68  Pulse: (!) 46 (!) 53  Resp: 10 16  Temp:      Last Pain:  Vitals:   04/01/16 1158  TempSrc: Oral  PainSc: 3       Patients Stated Pain Goal: 1 (04/01/16 1158)  Complications: No apparent anesthesia complications

## 2016-04-01 NOTE — Anesthesia Postprocedure Evaluation (Signed)
Anesthesia Post Note  Patient: Matthew Smith  Procedure(s) Performed: Procedure(s) (LRB): OPEN REDUCTION INTERNAL FIXATION (ORIF) TRIMALLEOLAR ANKLE FRACTURE AND SYNDESMOSIS (Right) RIGHT DELTOID LIGAMENT REPAIR (Right)  Patient location during evaluation: PACU Anesthesia Type: General and Regional Level of consciousness: awake and alert Pain management: pain level controlled Vital Signs Assessment: post-procedure vital signs reviewed and stable Respiratory status: spontaneous breathing, nonlabored ventilation, respiratory function stable and patient connected to nasal cannula oxygen Cardiovascular status: blood pressure returned to baseline and stable Postop Assessment: no signs of nausea or vomiting Anesthetic complications: no    Last Vitals:  Vitals:   04/01/16 1530 04/01/16 1545  BP: 108/67 120/73  Pulse: 60 (!) 56  Resp: 12 10  Temp:      Last Pain:  Vitals:   04/01/16 1545  TempSrc:   PainSc: 0-No pain                 Kennieth RadFitzgerald, Lorena Benham E

## 2016-04-01 NOTE — Progress Notes (Signed)
Assisted Dr. Ivin Bootyrews with right ultrasound popliteal saphenous  block. Side rails up, monitors on throughout procedure. See vital signs in flow sheet. Tolerated Procedure well.

## 2016-04-01 NOTE — Op Note (Signed)
NAMVanessa Barbara:  Pho, Yosiel              ACCOUNT NO.:  [id number]  MEDICAL RECORD NO.:  [id number]  LOCATION:                                 FACILITY:  PHYSICIAN:  Toni ArthursJohn Jaimya Feliciano, MD        DATE OF BIRTH:  08-12-1961  DATE OF PROCEDURE:  04/01/2016 DATE OF DISCHARGE:                              OPERATIVE REPORT   PREOPERATIVE DIAGNOSES: 1. Right ankle bimalleolar fracture (posterior and lateral). 2. Right ankle deltoid ligament rupture.  POSTOPERATIVE DIAGNOSES: 1. Right ankle trimalleolar fracture. 2. Right ankle syndesmosis disruption. 3. Right ankle deltoid ligament rupture.  PROCEDURE: 1. Open reduction and internal fixation of right ankle trimalleolar     fracture without fixation of posterior lip. 2. Open reduction and internal fixation of right ankle syndesmosis     disruption. 3. Stress examination of right ankle under fluoroscopy. 4. AP.   DICTATION ENDS HERE.     Toni ArthursJohn Haylo Fake, MD     JH/MEDQ  D:  04/01/2016  T:  04/01/2016  Job:  [id number]

## 2016-04-02 ENCOUNTER — Encounter (HOSPITAL_BASED_OUTPATIENT_CLINIC_OR_DEPARTMENT_OTHER): Payer: Self-pay | Admitting: Orthopedic Surgery

## 2016-06-14 DIAGNOSIS — S8261XD Displaced fracture of lateral malleolus of right fibula, subsequent encounter for closed fracture with routine healing: Secondary | ICD-10-CM | POA: Diagnosis not present

## 2016-06-18 ENCOUNTER — Other Ambulatory Visit: Payer: Self-pay | Admitting: Orthopedic Surgery

## 2016-06-22 DIAGNOSIS — M25571 Pain in right ankle and joints of right foot: Secondary | ICD-10-CM | POA: Diagnosis not present

## 2016-06-22 DIAGNOSIS — S8291XD Unspecified fracture of right lower leg, subsequent encounter for closed fracture with routine healing: Secondary | ICD-10-CM | POA: Diagnosis not present

## 2016-06-25 ENCOUNTER — Encounter (HOSPITAL_BASED_OUTPATIENT_CLINIC_OR_DEPARTMENT_OTHER): Payer: Self-pay | Admitting: *Deleted

## 2016-07-01 ENCOUNTER — Ambulatory Visit (HOSPITAL_BASED_OUTPATIENT_CLINIC_OR_DEPARTMENT_OTHER)
Admission: RE | Admit: 2016-07-01 | Discharge: 2016-07-01 | Disposition: A | Discharge status: Home / Self Care | Payer: BLUE CROSS/BLUE SHIELD | Source: Ambulatory Visit | Attending: Orthopedic Surgery | Admitting: Orthopedic Surgery

## 2016-07-01 ENCOUNTER — Ambulatory Visit (HOSPITAL_BASED_OUTPATIENT_CLINIC_OR_DEPARTMENT_OTHER): Payer: BLUE CROSS/BLUE SHIELD | Admitting: Anesthesiology

## 2016-07-01 ENCOUNTER — Encounter (HOSPITAL_BASED_OUTPATIENT_CLINIC_OR_DEPARTMENT_OTHER)
Admission: RE | Disposition: A | Discharge status: Home / Self Care | Payer: Self-pay | Source: Ambulatory Visit | Attending: Orthopedic Surgery

## 2016-07-01 ENCOUNTER — Encounter (HOSPITAL_BASED_OUTPATIENT_CLINIC_OR_DEPARTMENT_OTHER): Payer: Self-pay | Admitting: Anesthesiology

## 2016-07-01 DIAGNOSIS — T84498A Other mechanical complication of other internal orthopedic devices, implants and grafts, initial encounter: Secondary | ICD-10-CM | POA: Diagnosis not present

## 2016-07-01 DIAGNOSIS — E7800 Pure hypercholesterolemia, unspecified: Secondary | ICD-10-CM | POA: Insufficient documentation

## 2016-07-01 DIAGNOSIS — Z809 Family history of malignant neoplasm, unspecified: Secondary | ICD-10-CM | POA: Diagnosis not present

## 2016-07-01 DIAGNOSIS — Z472 Encounter for removal of internal fixation device: Secondary | ICD-10-CM | POA: Diagnosis not present

## 2016-07-01 DIAGNOSIS — Z8249 Family history of ischemic heart disease and other diseases of the circulatory system: Secondary | ICD-10-CM | POA: Diagnosis not present

## 2016-07-01 DIAGNOSIS — Z9852 Vasectomy status: Secondary | ICD-10-CM | POA: Diagnosis not present

## 2016-07-01 DIAGNOSIS — Z9889 Other specified postprocedural states: Secondary | ICD-10-CM

## 2016-07-01 HISTORY — DX: Pain due to internal orthopedic prosthetic devices, implants and grafts, initial encounter: T84.84XA

## 2016-07-01 HISTORY — PX: HARDWARE REMOVAL: SHX979

## 2016-07-01 SURGERY — REMOVAL, HARDWARE
Anesthesia: General | Site: Ankle | Laterality: Right

## 2016-07-01 MED ORDER — FENTANYL CITRATE (PF) 100 MCG/2ML IJ SOLN
50.0000 ug | INTRAMUSCULAR | Status: DC | PRN
  Administered 2016-07-01: 100 ug via INTRAVENOUS

## 2016-07-01 MED ORDER — KETOROLAC TROMETHAMINE 30 MG/ML IJ SOLN: 30.0000 mg | Freq: Once | INTRAMUSCULAR | Status: DC | PRN

## 2016-07-01 MED ORDER — OXYCODONE HCL 5 MG/5ML PO SOLN: 5.0000 mg | Freq: Once | ORAL | Status: DC | PRN

## 2016-07-01 MED ORDER — LIDOCAINE 2% (20 MG/ML) 5 ML SYRINGE
INTRAMUSCULAR | Status: DC | PRN
  Administered 2016-07-01: 50 mg via INTRAVENOUS

## 2016-07-01 MED ORDER — MIDAZOLAM HCL 2 MG/2ML IJ SOLN
INTRAMUSCULAR | Status: AC
  Filled 2016-07-01: qty 2

## 2016-07-01 MED ORDER — PROPOFOL 10 MG/ML IV BOLUS
INTRAVENOUS | Status: DC | PRN
  Administered 2016-07-01: 200 mg via INTRAVENOUS

## 2016-07-01 MED ORDER — LACTATED RINGERS IV SOLN
INTRAVENOUS | Status: DC
  Administered 2016-07-01: 10:00:00 via INTRAVENOUS

## 2016-07-01 MED ORDER — BUPIVACAINE HCL (PF) 0.25 % IJ SOLN
INTRAMUSCULAR | Status: AC
  Filled 2016-07-01: qty 30

## 2016-07-01 MED ORDER — SCOPOLAMINE 1 MG/3DAYS TD PT72: 1.0000 | MEDICATED_PATCH | Freq: Once | TRANSDERMAL | Status: DC | PRN

## 2016-07-01 MED ORDER — BUPIVACAINE HCL (PF) 0.5 % IJ SOLN
INTRAMUSCULAR | Status: AC
  Filled 2016-07-01: qty 30

## 2016-07-01 MED ORDER — DEXAMETHASONE SODIUM PHOSPHATE 10 MG/ML IJ SOLN
INTRAMUSCULAR | Status: AC
  Filled 2016-07-01: qty 1

## 2016-07-01 MED ORDER — CEFAZOLIN SODIUM-DEXTROSE 2-4 GM/100ML-% IV SOLN
INTRAVENOUS | Status: AC
  Filled 2016-07-01: qty 100

## 2016-07-01 MED ORDER — MIDAZOLAM HCL 2 MG/2ML IJ SOLN
1.0000 mg | INTRAMUSCULAR | Status: DC | PRN
  Administered 2016-07-01: 2 mg via INTRAVENOUS

## 2016-07-01 MED ORDER — SODIUM CHLORIDE 0.9 % IV SOLN: INTRAVENOUS | Status: DC

## 2016-07-01 MED ORDER — PROMETHAZINE HCL 25 MG/ML IJ SOLN: 6.2500 mg | INTRAMUSCULAR | Status: DC | PRN

## 2016-07-01 MED ORDER — KETOROLAC TROMETHAMINE 30 MG/ML IJ SOLN
INTRAMUSCULAR | Status: AC
  Filled 2016-07-01: qty 1

## 2016-07-01 MED ORDER — DEXAMETHASONE SODIUM PHOSPHATE 4 MG/ML IJ SOLN
INTRAMUSCULAR | Status: DC | PRN
  Administered 2016-07-01: 10 mg via INTRAVENOUS

## 2016-07-01 MED ORDER — OXYCODONE HCL 5 MG PO TABS: 5.0000 mg | ORAL_TABLET | Freq: Once | ORAL | Status: DC | PRN

## 2016-07-01 MED ORDER — CHLORHEXIDINE GLUCONATE 4 % EX LIQD: 60.0000 mL | Freq: Once | CUTANEOUS | Status: DC

## 2016-07-01 MED ORDER — MEPERIDINE HCL 25 MG/ML IJ SOLN: 6.2500 mg | INTRAMUSCULAR | Status: DC | PRN

## 2016-07-01 MED ORDER — BUPIVACAINE HCL (PF) 0.5 % IJ SOLN
INTRAMUSCULAR | Status: DC | PRN
  Administered 2016-07-01: 10 mL

## 2016-07-01 MED ORDER — ONDANSETRON HCL 4 MG/2ML IJ SOLN
INTRAMUSCULAR | Status: DC | PRN
  Administered 2016-07-01: 4 mg via INTRAVENOUS

## 2016-07-01 MED ORDER — ONDANSETRON HCL 4 MG/2ML IJ SOLN
INTRAMUSCULAR | Status: AC
  Filled 2016-07-01: qty 2

## 2016-07-01 MED ORDER — KETOROLAC TROMETHAMINE 30 MG/ML IJ SOLN
INTRAMUSCULAR | Status: DC | PRN
  Administered 2016-07-01: 30 mg via INTRAVENOUS

## 2016-07-01 MED ORDER — HYDROMORPHONE HCL 1 MG/ML IJ SOLN: 0.2500 mg | INTRAMUSCULAR | Status: DC | PRN

## 2016-07-01 MED ORDER — LIDOCAINE 2% (20 MG/ML) 5 ML SYRINGE
INTRAMUSCULAR | Status: AC
  Filled 2016-07-01: qty 5

## 2016-07-01 MED ORDER — CEFAZOLIN SODIUM-DEXTROSE 2-4 GM/100ML-% IV SOLN
2.0000 g | INTRAVENOUS | Status: AC
  Administered 2016-07-01: 2 g via INTRAVENOUS

## 2016-07-01 MED ORDER — FENTANYL CITRATE (PF) 100 MCG/2ML IJ SOLN
INTRAMUSCULAR | Status: AC
  Filled 2016-07-01: qty 2

## 2016-07-01 SURGICAL SUPPLY — 69 items
APL SKNCLS STERI-STRIP NONHPOA (GAUZE/BANDAGES/DRESSINGS)
BANDAGE ACE 4X5 VEL STRL LF (GAUZE/BANDAGES/DRESSINGS) IMPLANT
BANDAGE ESMARK 6X9 LF (GAUZE/BANDAGES/DRESSINGS) IMPLANT
BENZOIN TINCTURE PRP APPL 2/3 (GAUZE/BANDAGES/DRESSINGS) IMPLANT
BLADE SURG 15 STRL LF DISP TIS (BLADE) ×2 IMPLANT
BLADE SURG 15 STRL SS (BLADE) ×6
BNDG CMPR 9X4 STRL LF SNTH (GAUZE/BANDAGES/DRESSINGS)
BNDG CMPR 9X6 STRL LF SNTH (GAUZE/BANDAGES/DRESSINGS)
BNDG COHESIVE 4X5 TAN STRL (GAUZE/BANDAGES/DRESSINGS) ×2 IMPLANT
BNDG COHESIVE 6X5 TAN STRL LF (GAUZE/BANDAGES/DRESSINGS) IMPLANT
BNDG ESMARK 4X9 LF (GAUZE/BANDAGES/DRESSINGS) IMPLANT
BNDG ESMARK 6X9 LF (GAUZE/BANDAGES/DRESSINGS)
CHLORAPREP W/TINT 26ML (MISCELLANEOUS) ×3 IMPLANT
CLOSURE WOUND 1/2 X4 (GAUZE/BANDAGES/DRESSINGS) ×1
COVER BACK TABLE 60X90IN (DRAPES) ×3 IMPLANT
CUFF TOURNIQUET SINGLE 34IN LL (TOURNIQUET CUFF) IMPLANT
DECANTER SPIKE VIAL GLASS SM (MISCELLANEOUS) ×2 IMPLANT
DRAPE EXTREMITY T 121X128X90 (DRAPE) ×3 IMPLANT
DRAPE OEC MINIVIEW 54X84 (DRAPES) ×2 IMPLANT
DRAPE SURG 17X23 STRL (DRAPES) IMPLANT
DRAPE U-SHAPE 47X51 STRL (DRAPES) ×3 IMPLANT
DRSG MEPITEL 4X7.2 (GAUZE/BANDAGES/DRESSINGS) ×3 IMPLANT
DRSG PAD ABDOMINAL 8X10 ST (GAUZE/BANDAGES/DRESSINGS) IMPLANT
ELECT REM PT RETURN 9FT ADLT (ELECTROSURGICAL) ×3
ELECTRODE REM PT RTRN 9FT ADLT (ELECTROSURGICAL) ×1 IMPLANT
GAUZE SPONGE 4X4 12PLY STRL (GAUZE/BANDAGES/DRESSINGS) ×3 IMPLANT
GLOVE BIO SURGEON STRL SZ8 (GLOVE) ×3 IMPLANT
GLOVE BIOGEL PI IND STRL 7.0 (GLOVE) IMPLANT
GLOVE BIOGEL PI IND STRL 8 (GLOVE) ×2 IMPLANT
GLOVE BIOGEL PI INDICATOR 7.0 (GLOVE) ×2
GLOVE BIOGEL PI INDICATOR 8 (GLOVE) ×4
GLOVE ECLIPSE 6.5 STRL STRAW (GLOVE) ×2 IMPLANT
GLOVE ECLIPSE 7.5 STRL STRAW (GLOVE) ×3 IMPLANT
GLOVE EXAM NITRILE MD LF STRL (GLOVE) IMPLANT
GOWN STRL REUS W/ TWL LRG LVL3 (GOWN DISPOSABLE) ×1 IMPLANT
GOWN STRL REUS W/ TWL XL LVL3 (GOWN DISPOSABLE) ×2 IMPLANT
GOWN STRL REUS W/TWL LRG LVL3 (GOWN DISPOSABLE) ×3
GOWN STRL REUS W/TWL XL LVL3 (GOWN DISPOSABLE) ×6
NEEDLE HYPO 22GX1.5 SAFETY (NEEDLE) ×2 IMPLANT
NS IRRIG 1000ML POUR BTL (IV SOLUTION) ×2 IMPLANT
PACK BASIN DAY SURGERY FS (CUSTOM PROCEDURE TRAY) ×3 IMPLANT
PAD CAST 4YDX4 CTTN HI CHSV (CAST SUPPLIES) ×1 IMPLANT
PADDING CAST ABS 4INX4YD NS (CAST SUPPLIES)
PADDING CAST ABS COTTON 4X4 ST (CAST SUPPLIES) IMPLANT
PADDING CAST COTTON 4X4 STRL (CAST SUPPLIES) ×6
PADDING CAST COTTON 6X4 STRL (CAST SUPPLIES) IMPLANT
PENCIL BUTTON HOLSTER BLD 10FT (ELECTRODE) ×2 IMPLANT
SANITIZER HAND PURELL 535ML FO (MISCELLANEOUS) ×3 IMPLANT
SHEET MEDIUM DRAPE 40X70 STRL (DRAPES) ×3 IMPLANT
SLEEVE SCD COMPRESS KNEE MED (MISCELLANEOUS) ×3 IMPLANT
SPLINT FAST PLASTER 5X30 (CAST SUPPLIES)
SPLINT PLASTER CAST FAST 5X30 (CAST SUPPLIES) IMPLANT
SPONGE LAP 18X18 X RAY DECT (DISPOSABLE) ×3 IMPLANT
STOCKINETTE 6  STRL (DRAPES) ×2
STOCKINETTE 6 STRL (DRAPES) ×1 IMPLANT
STRIP CLOSURE SKIN 1/2X4 (GAUZE/BANDAGES/DRESSINGS) ×1 IMPLANT
SUCTION FRAZIER HANDLE 10FR (MISCELLANEOUS)
SUCTION TUBE FRAZIER 10FR DISP (MISCELLANEOUS) IMPLANT
SUT ETHILON 3 0 PS 1 (SUTURE) IMPLANT
SUT MNCRL AB 3-0 PS2 18 (SUTURE) ×3 IMPLANT
SUT VIC AB 0 SH 27 (SUTURE) IMPLANT
SUT VIC AB 2-0 SH 27 (SUTURE)
SUT VIC AB 2-0 SH 27XBRD (SUTURE) IMPLANT
SYR BULB 3OZ (MISCELLANEOUS) ×3 IMPLANT
SYR CONTROL 10ML LL (SYRINGE) ×2 IMPLANT
TOWEL OR 17X24 6PK STRL BLUE (TOWEL DISPOSABLE) ×3 IMPLANT
TUBE CONNECTING 20'X1/4 (TUBING)
TUBE CONNECTING 20X1/4 (TUBING) IMPLANT
UNDERPAD 30X30 (UNDERPADS AND DIAPERS) ×3 IMPLANT

## 2016-07-01 NOTE — Anesthesia Procedure Notes (Signed)
Procedure Name: LMA Insertion Performed by: Alitzel Cookson W Pre-anesthesia Checklist: Patient identified, Emergency Drugs available, Suction available and Patient being monitored Patient Re-evaluated:Patient Re-evaluated prior to inductionOxygen Delivery Method: Circle system utilized Preoxygenation: Pre-oxygenation with 100% oxygen Intubation Type: IV induction Ventilation: Mask ventilation without difficulty LMA: LMA inserted LMA Size: 5.0 Number of attempts: 1 Placement Confirmation: positive ETCO2 Tube secured with: Tape Dental Injury: Teeth and Oropharynx as per pre-operative assessment        

## 2016-07-01 NOTE — Anesthesia Postprocedure Evaluation (Signed)
Anesthesia Post Note  Patient: Matthew BarbaraJonathan Trombetta  Procedure(s) Performed: Procedure(s) (LRB): REMOVAL OF DEEP IMPLANTS RIGHT ANKLE (Right)  Patient location during evaluation: PACU Anesthesia Type: General Level of consciousness: sedated and patient cooperative Pain management: pain level controlled Vital Signs Assessment: post-procedure vital signs reviewed and stable Respiratory status: spontaneous breathing Cardiovascular status: stable Anesthetic complications: no    Last Vitals:  Vitals:   07/01/16 1115 07/01/16 1130  BP: 135/88 (!) 140/91  Pulse: (!) 52 (!) 53  Resp: (!) 9 16  Temp:  36.5 C    Last Pain:  Vitals:   07/01/16 1046  TempSrc:   PainSc: 0-No pain                 Lewie LoronJohn Shellia Hartl

## 2016-07-01 NOTE — Brief Op Note (Signed)
07/01/2016  10:49 AM  PATIENT:  Matthew BarbaraJonathan Smith  55 y.o. male  PRE-OPERATIVE DIAGNOSIS:  Right ankle retained hardware  POST-OPERATIVE DIAGNOSIS:  Right ankle retained hardware  Procedure(s): 1.  REMOVAL OF DEEP IMPLANTS RIGHT ANKLE 2.  AP and lateral xrays of the right ankle  SURGEON:  Toni ArthursJohn Irianna Gilday, MD  ASSISTANT: Alfredo MartinezJustin Ollis, PA-C  ANESTHESIA:   General  EBL:  minimal   TOURNIQUET:  0  COMPLICATIONS:  None apparent  DISPOSITION:  Extubated, awake and stable to recovery.  DICTATION ID:

## 2016-07-01 NOTE — Anesthesia Preprocedure Evaluation (Signed)
Anesthesia Evaluation  Patient identified by MRN, date of birth, ID band Patient awake    Reviewed: Allergy & Precautions, NPO status , Patient's Chart, lab work & pertinent test results  Airway Mallampati: I  TM Distance: >3 FB     Dental  (+) Teeth Intact, Dental Advisory Given   Pulmonary neg pulmonary ROS,    breath sounds clear to auscultation       Cardiovascular negative cardio ROS   Rhythm:Regular Rate:Normal     Neuro/Psych negative neurological ROS  negative psych ROS   GI/Hepatic negative GI ROS, Neg liver ROS,   Endo/Other  negative endocrine ROS  Renal/GU negative Renal ROS     Musculoskeletal negative musculoskeletal ROS (+)   Abdominal   Peds  Hematology negative hematology ROS (+)   Anesthesia Other Findings   Reproductive/Obstetrics negative OB ROS                             Anesthesia Physical  Anesthesia Plan  ASA: II  Anesthesia Plan: General   Post-op Pain Management: GA combined w/ Regional for post-op pain   Induction: Intravenous  Airway Management Planned: LMA  Additional Equipment:   Intra-op Plan:   Post-operative Plan: Extubation in OR  Informed Consent: I have reviewed the patients History and Physical, chart, labs and discussed the procedure including the risks, benefits and alternatives for the proposed anesthesia with the patient or authorized representative who has indicated his/her understanding and acceptance.   Dental advisory given  Plan Discussed with: CRNA  Anesthesia Plan Comments:         Anesthesia Quick Evaluation

## 2016-07-01 NOTE — Transfer of Care (Signed)
Immediate Anesthesia Transfer of Care Note  Patient: Matthew Smith  Procedure(s) Performed: Procedure(s): REMOVAL OF DEEP IMPLANTS RIGHT ANKLE (Right)  Patient Location: PACU  Anesthesia Type:General  Level of Consciousness: awake and sedated  Airway & Oxygen Therapy: Patient Spontanous Breathing and Patient connected to face mask oxygen  Post-op Assessment: Report given to RN and Post -op Vital signs reviewed and stable  Post vital signs: Reviewed and stable  Last Vitals:  Vitals:   07/01/16 0940  BP: 136/87  Pulse: 60  Resp: 20  Temp: 36.7 C    Last Pain:  Vitals:   07/01/16 0940  TempSrc: Oral         Complications: No apparent anesthesia complications

## 2016-07-01 NOTE — H&P (Signed)
Matthew BarbaraJonathan Smith is an 55 y.o. male.   Chief Complaint: right ankle pain HPI: 55 y/o male with right ankle retained hardware after ORIF of his fibula and syndesmosis about 4 months ago.  He presents today for removal of the syndesmosis screws.  Past Medical History:  Diagnosis Date  . Hypercholesterolemia   . Painful orthopaedic hardware [facility](HCC)    right ankle    Past Surgical History:  Procedure Laterality Date  . ADENOIDECTOMY    . LIGAMENT REPAIR Right 04/01/2016   Procedure: RIGHT DELTOID LIGAMENT REPAIR;  Surgeon: Toni ArthursJohn Alwilda Gilland, MD;  Location: [facility];  Service: Orthopedics;  Laterality: Right;  . ORIF ANKLE FRACTURE Right 04/01/2016   Procedure: OPEN REDUCTION INTERNAL FIXATION (ORIF) TRIMALLEOLAR ANKLE FRACTURE AND SYNDESMOSIS;  Surgeon: Toni ArthursJohn Aarit Kashuba, MD;  Location: [facility];  Service: Orthopedics;  Laterality: Right;  Marland Kitchen. VASECTOMY      Family History  Problem Relation Age of Onset  . Cancer Mother   . Heart attack Father    Social History:  reports that he has never smoked. He has never used smokeless tobacco. He reports that he drinks alcohol. He reports that he does not use drugs.  Allergies: No Known Allergies  No prescriptions prior to admission.    No results found for this or any previous visit (from the past 48 hour(s)). No results found.  ROS  No recent f/c/n/v/wt loss  Blood pressure 136/87, pulse 60, temperature 98.1 F (36.7 C), temperature source Oral, resp. rate 20, height 5\' 11"  (1.803 m), weight 87.5 kg (193 lb), SpO2 100 %. Physical Exam  wn wd male in nad.  A and o x 4.  Mood an daffect normal.  EOMI.  resp unlabored.  R ankle with healthy skin.  Incision healed.  No lymphadenopathy.  5/5 strength in PF and DF of the ankle.  DF just to neutral.  Sens to LT intact.  Assessment/Plan R ankle retained hardware - to OR for removal of the syndesmosis screws to improve motion at the ankle.  The risks and benefits of the  alternative treatment options have been discussed in detail.  The patient wishes to proceed with surgery and specifically understands risks of bleeding, infection, nerve damage, blood clots, need for additional surgery, amputation and death.   Toni ArthursHEWITT, Erlinda Solinger, MD 07/01/2016, 10:05 AM

## 2016-07-01 NOTE — Discharge Instructions (Addendum)
Matthew ArthursJohn Hewitt, MD [facility]  Please read the following information regarding your care after surgery.  Medications  You only need a prescription for the narcotic pain medicine (ex. oxycodone, Percocet, Norco).  All of the other medicines listed below are available over the counter. X acetominophen (Tylenol) 650 mg every 4-6 hours as you need for minor pain X two tables of aleve 220 mg twice daily as needed for minor pain  Weight Bearing X Bear weight when you are able on your operated leg or foot.   Dressing X Remove your dressing 3 days after surgery and cover the incisions with dry dressings.    After your dressing, cast or splint is removed; you may shower, but do not soak or scrub the wound.  Allow the water to run over it, and then gently pat it dry.  Swelling It is normal for you to have swelling where you had surgery.  To reduce swelling and pain, keep your toes above your nose for at least 3 days after surgery.  It may be necessary to keep your foot or leg elevated for several weeks.  If it hurts, it should be elevated.  Follow Up Call my office at [phone] when you are discharged from the hospital or surgery center to schedule an appointment to be seen two weeks after surgery.  Call my office at [phone] if you develop a fever >101.5 F, nausea, vomiting, bleeding from the surgical site or severe pain.     Post Anesthesia Home Care Instructions  Activity: Get plenty of rest for the remainder of the day. A responsible adult should stay with you for 24 hours following the procedure.  For the next 24 hours, DO NOT: -Drive a car -Advertising copywriterperate machinery -Drink alcoholic beverages -Take any medication unless instructed by your physician -Make any legal decisions or sign important papers.  Meals: Start with liquid foods such as gelatin or soup. Progress to regular foods as tolerated. Avoid greasy, spicy, heavy foods. If nausea and/or vomiting occur, drink only  clear liquids until the nausea and/or vomiting subsides. Call your physician if vomiting continues.  Special Instructions/Symptoms: Your throat may feel dry or sore from the anesthesia or the breathing tube placed in your throat during surgery. If this causes discomfort, gargle with warm salt water. The discomfort should disappear within 24 hours.  If you had a scopolamine patch placed behind your ear for the management of post- operative nausea and/or vomiting:  1. The medication in the patch is effective for 72 hours, after which it should be removed.  Wrap patch in a tissue and discard in the trash. Wash hands thoroughly with soap and water. 2. You may remove the patch earlier than 72 hours if you experience unpleasant side effects which may include dry mouth, dizziness or visual disturbances. 3. Avoid touching the patch. Wash your hands with soap and water after contact with the patch.

## 2016-07-02 ENCOUNTER — Encounter (HOSPITAL_BASED_OUTPATIENT_CLINIC_OR_DEPARTMENT_OTHER): Payer: Self-pay | Admitting: Orthopedic Surgery

## 2016-07-02 NOTE — Op Note (Signed)
NAMVanessa Barbara:  Smith, Matthew              ACCOUNT NO.:  [id number]  MEDICAL RECORD NO.:  [id number]  LOCATION:                                 FACILITY:  PHYSICIAN:  Toni ArthursJohn Dennisse Swader, MD        DATE OF BIRTH:  Jan 27, 1961  DATE OF PROCEDURE:  07/01/2016 DATE OF DISCHARGE:                              OPERATIVE REPORT   PREOPERATIVE DIAGNOSIS:  Retained hardware of right ankle status post open reduction and internal fixation of the syndesmosis.  POSTOPERATIVE DIAGNOSIS:  Retained hardware of right ankle status post open reduction and internal fixation of the syndesmosis.  PROCEDURE: 1. Removal of deep implants from the right ankle. 2. AP and lateral radiographs of the right ankle.  SURGEON:  Toni ArthursJohn Suzannah Bettes, M.D.  ASSISTANT:  Alfredo MartinezJustin Ollis, PA-C.  ANESTHESIA:  General.  ESTIMATED BLOOD LOSS:  Minimal.  TOURNIQUET TIME:  Zero.  COMPLICATIONS:  None apparent.  DISPOSITION:  Extubated, awake, and stable to recovery.  INDICATIONS FOR PROCEDURE:  The patient is a 55 year old male who is now 4 months out from ORIF of his right ankle syndesmosis disruption.  He presents today for removal of his syndesmosis screws.  He understands the risks and benefits of the alternative treatment options and elects surgical treatment.  He specifically understands risks of bleeding, infection, nerve damage, blood clots, need for additional surgery, continued pain, posttraumatic arthritis, amputation, and death.  PROCEDURE IN DETAIL:  After preoperative consent was obtained and the correct operative site was identified, the patient was brought to the operating room and placed supine on the operating table.  General anesthesia was induced.  Preoperative antibiotics were administered. Surgical time-out was taken.  The right lower extremity was prepped and draped in standard sterile fashion.  Fluoroscopic image was obtained localizing the 2 syndesmosis screws.  A small incision was made at the previous lateral  incision.  Dissection was carried down through the skin and subcutaneous tissues to the superficial aspect of the plate.  The screw heads were identified and cleared of all soft tissues.  The screwdriver was used to remove both screws in their entirety.  AP and lateral radiographs then confirmed complete removal of both syndesmosis screws.  Wound was irrigated copiously.  Monocryl were used to close the subcutaneous tissues, and Steri-Strips were used to close the skin incision.  Sterile dressings were applied followed by compression wrap. A 10 mL of 0.5% Marcaine were infiltrated into the subcutaneous tissues for postoperative pain control.  The patient was awakened from anesthesia and transported to the recovery room in stable condition.  FOLLOWUP PLAN:  The patient will be weightbearing as tolerated on the right lower extremity.  He will follow up with me in 2 weeks for a wound check.  Alfredo MartinezJustin Ollis PA-C, was present and scrubbed for the duration of the case.  His assistance was essential in positioning the patient, prepping and draping, gaining and maintaining exposure, performing the operation, closing and dressing of the wounds.  RADIOGRAPHS:  AP and lateral radiographs of the right ankle were obtained intraoperatively.  These show interval removal of both syndesmosis screws from the right ankle.  The fibular fracture appears healed.  The syndesmosis appeared  stable.     Toni ArthursJohn Edrian Melucci, MD     JH/MEDQ  D:  07/01/2016  T:  07/02/2016  Job:  [id number]

## 2016-07-12 DIAGNOSIS — S8291XD Unspecified fracture of right lower leg, subsequent encounter for closed fracture with routine healing: Secondary | ICD-10-CM | POA: Diagnosis not present

## 2016-07-12 DIAGNOSIS — Z0000 Encounter for general adult medical examination without abnormal findings: Secondary | ICD-10-CM | POA: Diagnosis not present

## 2016-07-12 DIAGNOSIS — Z1159 Encounter for screening for other viral diseases: Secondary | ICD-10-CM | POA: Diagnosis not present

## 2016-07-12 DIAGNOSIS — M25571 Pain in right ankle and joints of right foot: Secondary | ICD-10-CM | POA: Diagnosis not present

## 2016-07-12 LAB — HEPATIC FUNCTION PANEL
ALT: 18 (ref 10–40)
AST: 21 (ref 14–40)
Alkaline Phosphatase: 60 (ref 25–125)
Bilirubin, Total: 0.8

## 2016-07-12 LAB — LIPID PANEL
CHOLESTEROL: 238 — AB (ref 0–200)
HDL: 77 — AB (ref 35–70)
LDL Cholesterol: 137
Triglycerides: 118 (ref 40–160)

## 2016-07-12 LAB — CBC AND DIFFERENTIAL
HCT: 44 (ref 41–53)
Hemoglobin: 14.9 (ref 13.5–17.5)
Platelets: 234 (ref 150–399)
WBC: 3.4

## 2016-07-12 LAB — TSH: TSH: 4.83 (ref 0.41–5.90)

## 2016-07-12 LAB — BASIC METABOLIC PANEL
BUN: 11 (ref 4–21)
Creatinine: 0.9 (ref 0.6–1.3)
POTASSIUM: 4 (ref 3.4–5.3)
SODIUM: 138 (ref 137–147)

## 2016-07-12 LAB — HM HEPATITIS C SCREENING LAB: HM HEPATITIS C SCREENING: NEGATIVE

## 2016-07-14 DIAGNOSIS — M25571 Pain in right ankle and joints of right foot: Secondary | ICD-10-CM | POA: Diagnosis not present

## 2016-07-14 DIAGNOSIS — S8291XD Unspecified fracture of right lower leg, subsequent encounter for closed fracture with routine healing: Secondary | ICD-10-CM | POA: Diagnosis not present

## 2016-09-29 DIAGNOSIS — F4323 Adjustment disorder with mixed anxiety and depressed mood: Secondary | ICD-10-CM | POA: Diagnosis not present

## 2016-10-12 DIAGNOSIS — F4323 Adjustment disorder with mixed anxiety and depressed mood: Secondary | ICD-10-CM | POA: Diagnosis not present

## 2016-10-25 DIAGNOSIS — F4323 Adjustment disorder with mixed anxiety and depressed mood: Secondary | ICD-10-CM | POA: Diagnosis not present

## 2016-11-09 DIAGNOSIS — F4323 Adjustment disorder with mixed anxiety and depressed mood: Secondary | ICD-10-CM | POA: Diagnosis not present

## 2016-12-03 DIAGNOSIS — F4323 Adjustment disorder with mixed anxiety and depressed mood: Secondary | ICD-10-CM | POA: Diagnosis not present

## 2016-12-10 DIAGNOSIS — F4323 Adjustment disorder with mixed anxiety and depressed mood: Secondary | ICD-10-CM | POA: Diagnosis not present

## 2017-01-07 DIAGNOSIS — F4323 Adjustment disorder with mixed anxiety and depressed mood: Secondary | ICD-10-CM | POA: Diagnosis not present

## 2017-02-22 DIAGNOSIS — F4323 Adjustment disorder with mixed anxiety and depressed mood: Secondary | ICD-10-CM | POA: Diagnosis not present

## 2017-03-11 DIAGNOSIS — F4323 Adjustment disorder with mixed anxiety and depressed mood: Secondary | ICD-10-CM | POA: Diagnosis not present

## 2017-04-13 DIAGNOSIS — F4323 Adjustment disorder with mixed anxiety and depressed mood: Secondary | ICD-10-CM | POA: Diagnosis not present

## 2017-04-29 DIAGNOSIS — F4323 Adjustment disorder with mixed anxiety and depressed mood: Secondary | ICD-10-CM | POA: Diagnosis not present

## 2017-05-13 DIAGNOSIS — F4323 Adjustment disorder with mixed anxiety and depressed mood: Secondary | ICD-10-CM | POA: Diagnosis not present

## 2017-05-20 DIAGNOSIS — F4323 Adjustment disorder with mixed anxiety and depressed mood: Secondary | ICD-10-CM | POA: Diagnosis not present

## 2017-05-26 DIAGNOSIS — F4323 Adjustment disorder with mixed anxiety and depressed mood: Secondary | ICD-10-CM | POA: Diagnosis not present

## 2017-06-10 DIAGNOSIS — F4323 Adjustment disorder with mixed anxiety and depressed mood: Secondary | ICD-10-CM | POA: Diagnosis not present

## 2017-07-10 DIAGNOSIS — F4323 Adjustment disorder with mixed anxiety and depressed mood: Secondary | ICD-10-CM | POA: Diagnosis not present

## 2017-08-12 DIAGNOSIS — F4323 Adjustment disorder with mixed anxiety and depressed mood: Secondary | ICD-10-CM | POA: Diagnosis not present

## 2017-09-01 IMAGING — DX DG ANKLE COMPLETE 3+V*R*
3 series · 3 of 3 positions shown · non-contrast
Comparison: None.

CLINICAL DATA: Status post fall while playing soccer, with right
ankle deformity. Initial encounter.

EXAM:
RIGHT ANKLE - COMPLETE 3+ VIEW

[ankle ap]
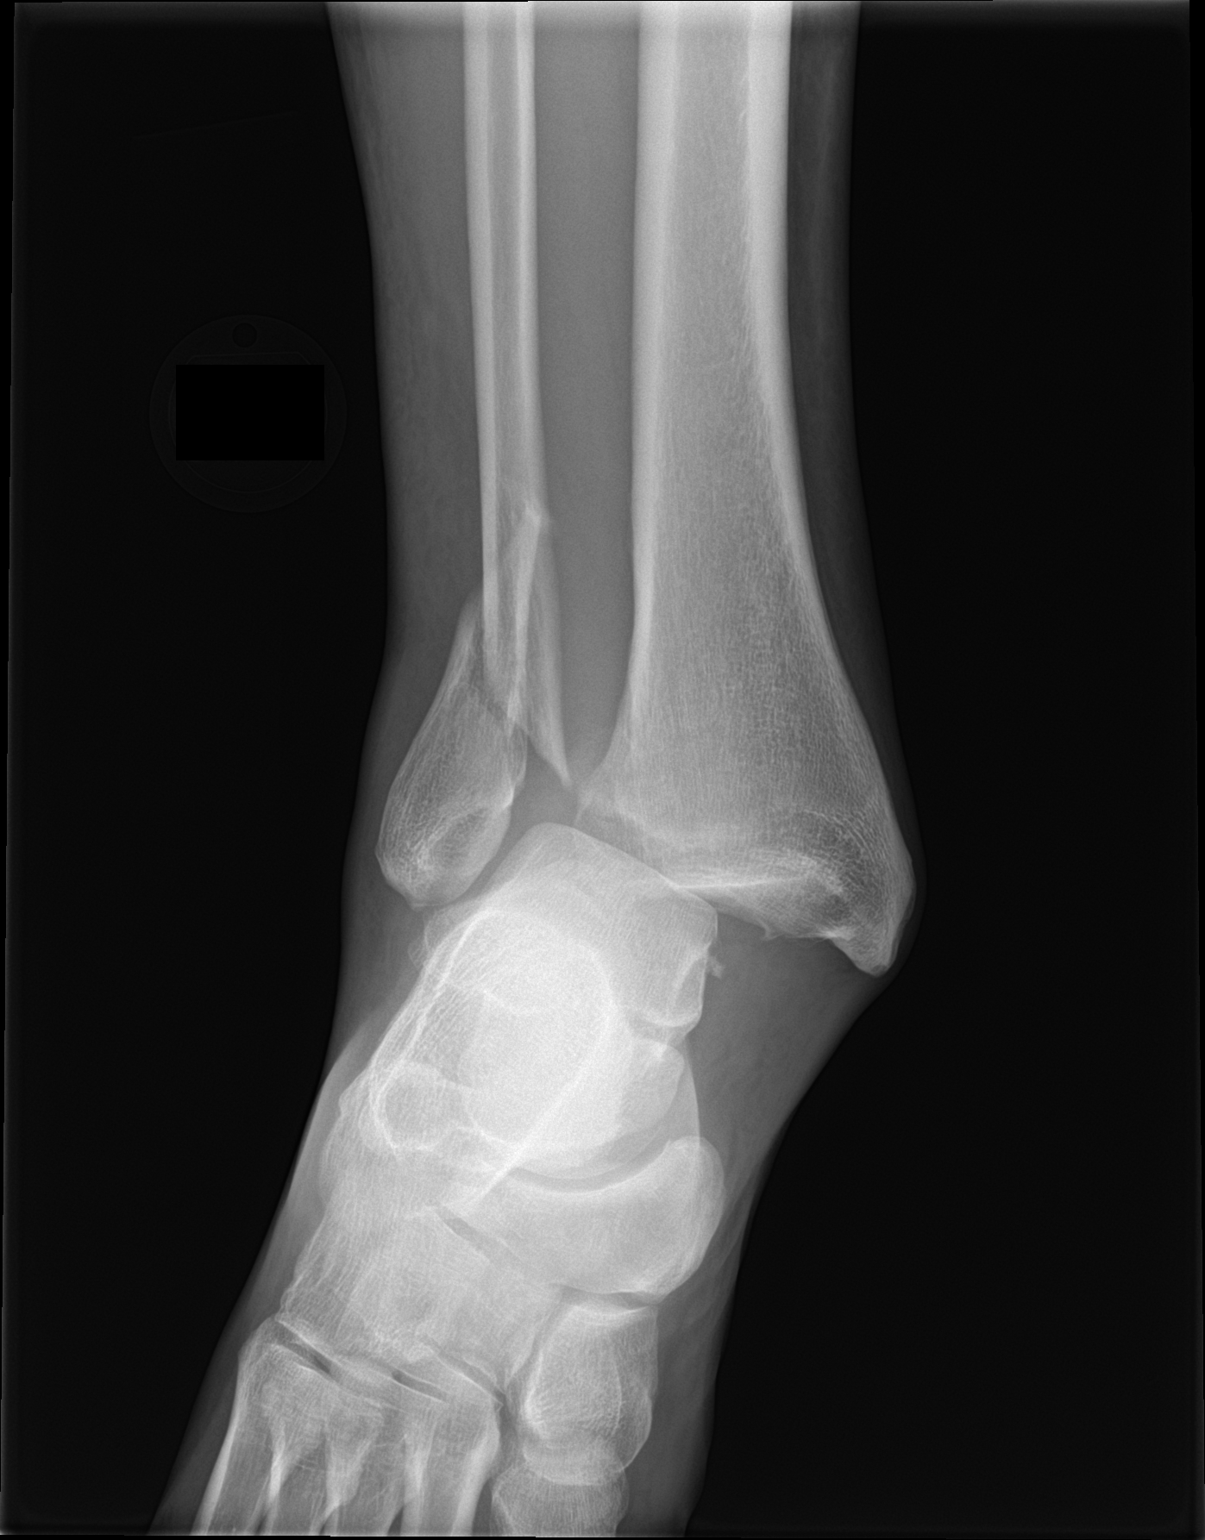

[ankle obl]
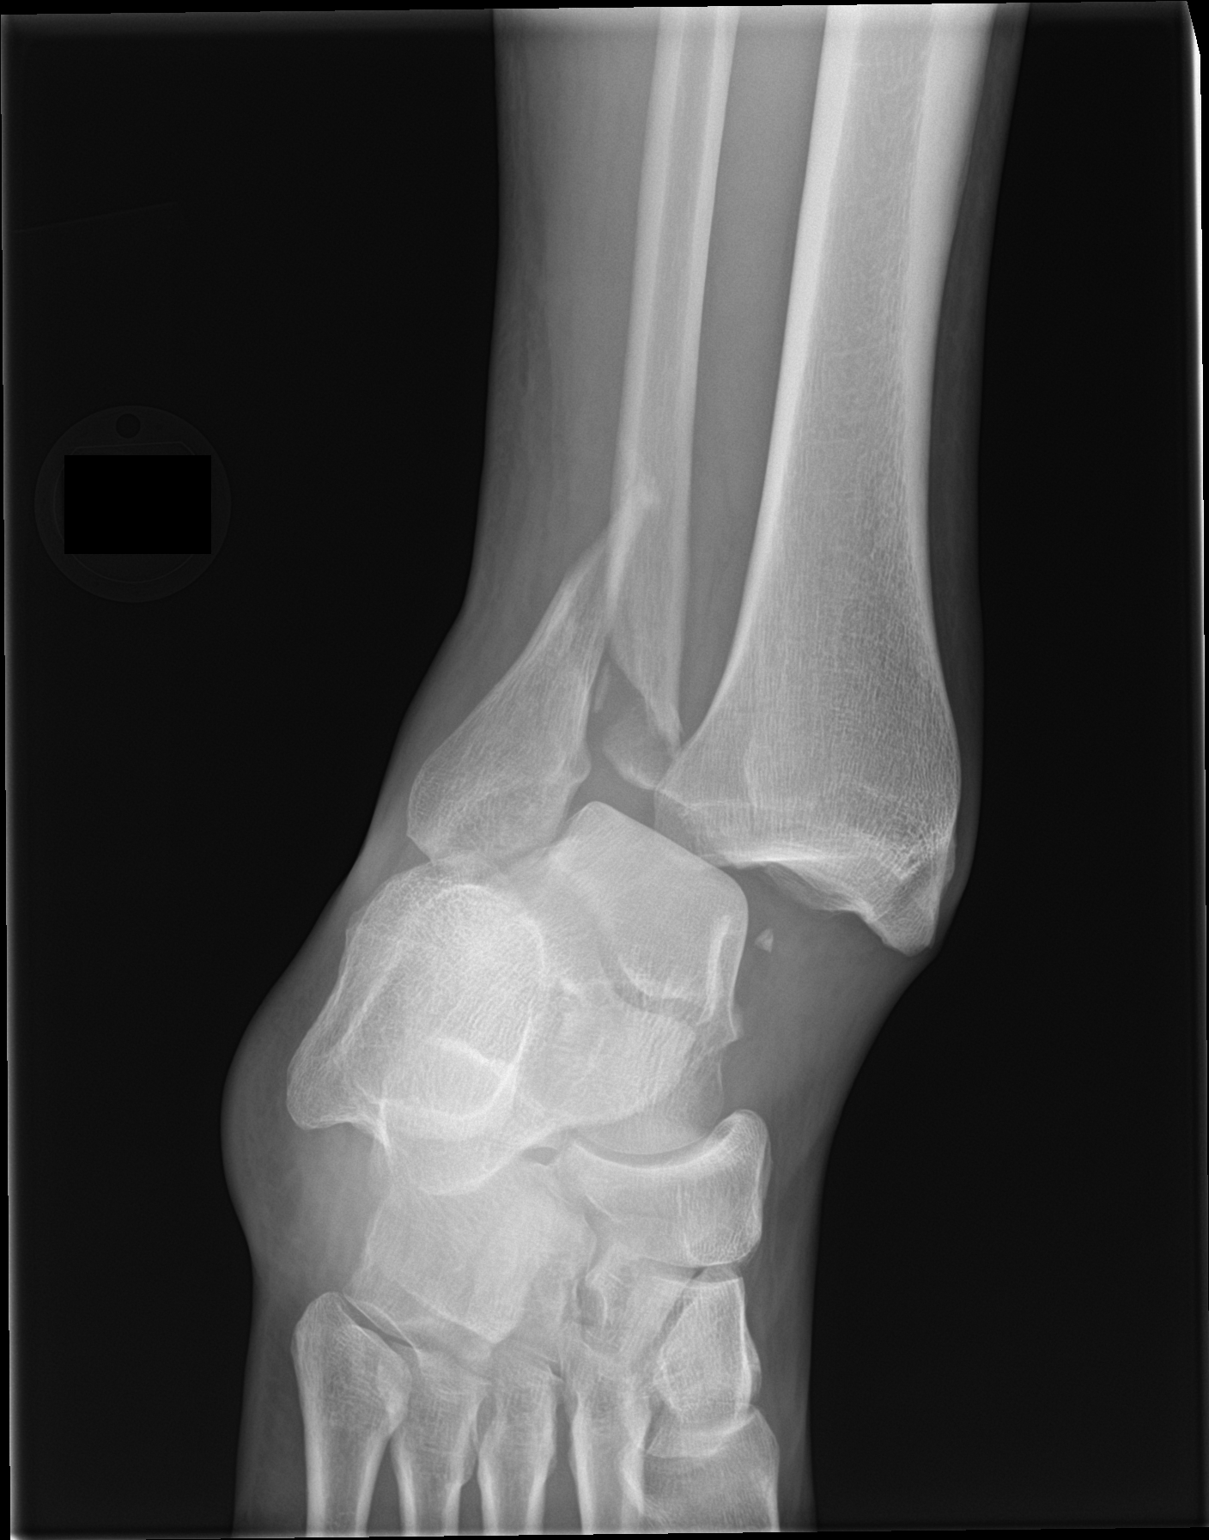

[ankle lat]
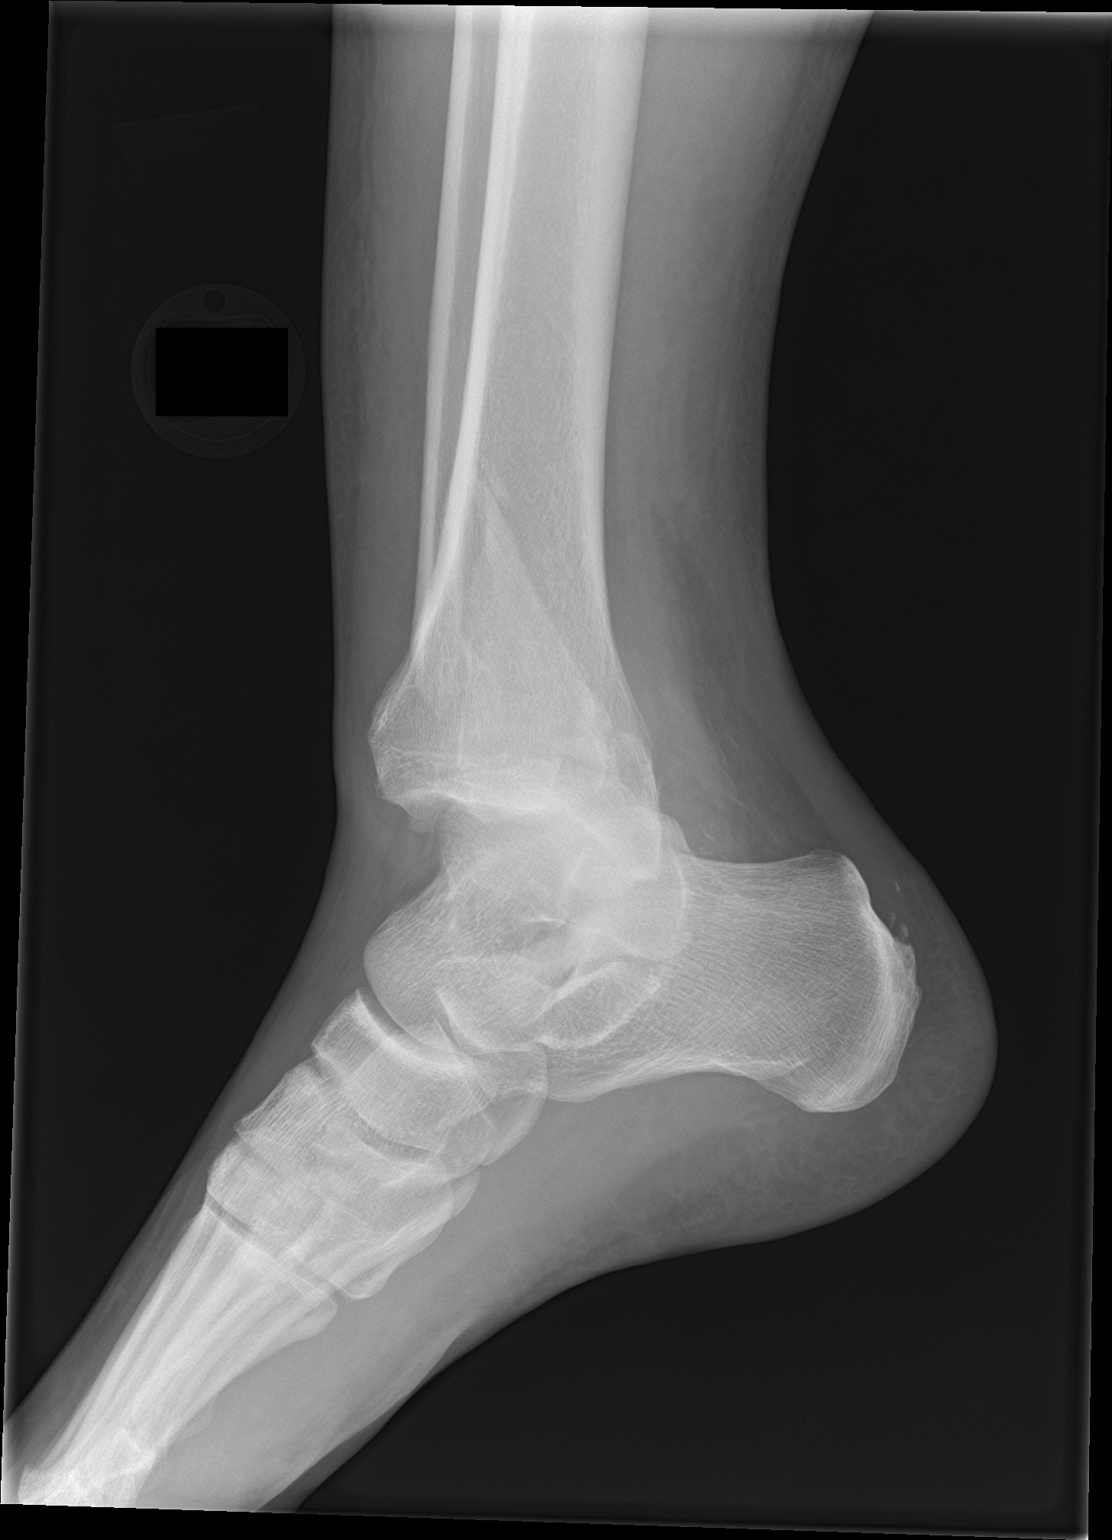

[3 of 3 positions shown; findings below may reference images not displayed]

FINDINGS: There is a comminuted fracture of the distal fibula, with shortening
and superior and lateral displacement. There is also a small
avulsion fracture from the distal tip of the tibia. There is
associated lateral dislocation of the talus. There may be a
posterior malleolar fragment, though this is difficult to fully
assess.

Surrounding soft tissue swelling is noted. No additional fractures
are seen.
IMPRESSION: 1. Comminuted fracture of the distal fibula, with shortening and
superior and lateral displacement. Small avulsion fracture from the
distal tip of the tibia.
2. Associated lateral dislocation of the talus.
3. Possible posterior malleolar fragment, difficult to fully assess.

## 2017-09-21 DIAGNOSIS — F4323 Adjustment disorder with mixed anxiety and depressed mood: Secondary | ICD-10-CM | POA: Diagnosis not present

## 2017-10-21 DIAGNOSIS — F4323 Adjustment disorder with mixed anxiety and depressed mood: Secondary | ICD-10-CM | POA: Diagnosis not present

## 2017-12-08 DIAGNOSIS — F4323 Adjustment disorder with mixed anxiety and depressed mood: Secondary | ICD-10-CM | POA: Diagnosis not present

## 2017-12-29 DIAGNOSIS — F4323 Adjustment disorder with mixed anxiety and depressed mood: Secondary | ICD-10-CM | POA: Diagnosis not present

## 2018-01-19 ENCOUNTER — Ambulatory Visit: Payer: PRIVATE HEALTH INSURANCE | Admitting: Adult Health

## 2018-02-09 DIAGNOSIS — F4323 Adjustment disorder with mixed anxiety and depressed mood: Secondary | ICD-10-CM | POA: Diagnosis not present

## 2018-02-22 ENCOUNTER — Telehealth: Payer: Self-pay | Admitting: Adult Health

## 2018-02-22 NOTE — Telephone Encounter (Signed)
Patient is sch as a new patient on 03/02/18 for new patient visit and a CPE sch for the next day 03/03/18. He states that his work schedule is set in place for the next few months and its hard for him get personal appts scheduled. Since we have to cancel his CPE on the 12th, he is requesting that the 03/02/18 new patient visit also consist of a CPE. I explained that these are two separate visits but he was adamant that I send this message to make the clinic staff aware of his request.

## 2018-03-02 ENCOUNTER — Ambulatory Visit (INDEPENDENT_AMBULATORY_CARE_PROVIDER_SITE_OTHER): Payer: BLUE CROSS/BLUE SHIELD | Admitting: Family Medicine

## 2018-03-02 ENCOUNTER — Encounter: Payer: Self-pay | Admitting: Family Medicine

## 2018-03-02 VITALS — BP 143/85 | HR 57 | Ht 71.0 in | Wt 193.6 lb

## 2018-03-02 DIAGNOSIS — Z8042 Family history of malignant neoplasm of prostate: Secondary | ICD-10-CM

## 2018-03-02 DIAGNOSIS — E7800 Pure hypercholesterolemia, unspecified: Secondary | ICD-10-CM | POA: Diagnosis not present

## 2018-03-02 DIAGNOSIS — Z113 Encounter for screening for infections with a predominantly sexual mode of transmission: Secondary | ICD-10-CM

## 2018-03-02 DIAGNOSIS — Z0000 Encounter for general adult medical examination without abnormal findings: Secondary | ICD-10-CM

## 2018-03-02 DIAGNOSIS — Z719 Counseling, unspecified: Secondary | ICD-10-CM

## 2018-03-02 DIAGNOSIS — R7989 Other specified abnormal findings of blood chemistry: Secondary | ICD-10-CM

## 2018-03-02 DIAGNOSIS — Z566 Other physical and mental strain related to work: Secondary | ICD-10-CM

## 2018-03-02 DIAGNOSIS — F439 Reaction to severe stress, unspecified: Secondary | ICD-10-CM

## 2018-03-02 NOTE — Patient Instructions (Signed)
Recommend follow-up in 6 months for prostate eval and further health maintenance evaluation  Guidelines for a Low Cholesterol, Low Saturated Fat Diet   Fats - Limit total intake of fats and oils. - Avoid butter, stick margarine, shortening, lard, palm and coconut oils. - Limit mayonnaise, salad dressings, gravies and sauces, unless they are homemade with low-fat ingredients. - Limit chocolate. - Choose low-fat and nonfat products, such as low-fat mayonnaise, low-fat or non-hydrogenated peanut butter, low-fat or fat-free salad dressings and nonfat gravy. - Use vegetable oil, such as canola or olive oil. - Look for margarine that does not contain trans fatty acids. - Use nuts in moderate amounts. - Read ingredient labels carefully to determine both amount and type of fat present in foods. Limit saturated and trans fats! - Avoid high-fat processed and convenience foods.  Meats and Meat Alternatives - Choose fish, chicken, [place] and lean meats. - Use dried beans, peas, lentils and tofu. - Limit egg yolks to three to four per week. - If you eat red meat, limit to no more than three servings per week and choose loin or round cuts. - Avoid fatty meats, such as bacon, sausage, franks, luncheon meats and ribs. - Avoid all organ meats, including liver.  Dairy - Choose nonfat or low-fat milk, yogurt and cottage cheese. - Most cheeses are high in fat. Choose cheeses made from non-fat milk, such as mozzarella and ricotta cheese. - Choose light or fat-free cream cheese and sour cream. - Avoid cream and sauces made with cream.  Fruits and Vegetables - Eat a wide variety of fruits and vegetables. - Use lemon juice, vinegar or "mist" olive oil on vegetables. - Avoid adding sauces, fat or oil to vegetables.  Breads, Cereals and Grains - Choose whole-grain breads, cereals, pastas and rice. - Avoid high-fat snack foods, such as granola, cookies, pies, pastries, doughnuts and croissants.  Cooking  Tips - Avoid deep fried foods. - Trim visible fat off meats and remove skin from poultry before cooking. - Bake, broil, boil, poach or roast poultry, fish and lean meats. - Drain and discard fat that drains out of meat as you cook it. - Add little or no fat to foods. - Use vegetable oil sprays to grease pans for cooking or baking. - Steam vegetables. - Use herbs or no-oil marinades to flavor foods.

## 2018-03-02 NOTE — Progress Notes (Signed)
New patient office visit note:  Impression and Recommendations:    1. Encounter for wellness examination   2. Hypercholesterolemia   3. Family history of prostate cancer-in grandfather   4. History of elevated TSH   5. Stress at work   6. Stress at home   7. Screening examination for sexually transmitted disease   8. Health education/counseling     1. BMI Counseling Explained to patient what BMI refers to, and what it means medically.    Told patient to think about it as a "medical risk stratification measurement" and how increasing BMI is associated with increasing risk/ or worsening state of various diseases such as hypertension, hyperlipidemia, diabetes, premature OA, depression etc.  American Heart Association guidelines for healthy diet, basically Mediterranean diet, and exercise guidelines of 30 minutes 5 days per week or more discussed in detail.  Health counseling performed.  All questions answered.  2. Lifestyle & Preventative Health Maintenance - Advised patient to continue working toward exercising to improve overall mental, physical, and emotional health.    - Continue your great habit of engaging in daily physical activity.  Recommended that the patient eventually strive for at least 150 minutes of moderate cardiovascular activity per week according to guidelines established by the [facility].   - Healthy dietary habits encouraged, including low-carb, and high amounts of lean protein in diet.   - Patient should also consume adequate amounts of water - half of body weight in oz of water per day.  3. Follow-Up - Advised patient to return for routine follow-up once yearly for physical.  - Patient wishes for blood work to be drawn ASAP.  Requests full panel of blood work for sexual health.  He would also like to be tested for HSV.    Education and routine counseling performed. Handouts provided.  Orders Placed This Encounter  Procedures  .  Chlamydia/Gonococcus/Trichomonas, NAA  . CBC with Differential/Platelet  . Comprehensive metabolic panel  . Hemoglobin A1c  . Hepatitis C antibody  . HIV antibody  . Lipid panel  . T4, free  . TSH  . T3, free  . VITAMIN D 25 Hydroxy (Vit-D Deficiency, Fractures)  . RPR  . HSV Type I/II IgG, IgMw/ reflex    Gross side effects, risk and benefits, and alternatives of medications discussed with patient.  Patient is aware that all medications have potential side effects and we are unable to predict every side effect or drug-drug interaction that may occur.  Expresses verbal understanding and consents to current therapy plan and treatment regimen.  Return for Come tomorrow morning for fasting blood work and routine STD screening; then in 6-12 mo .  Please see AVS handed out to patient at the end of our visit for further patient instructions/ counseling done pertaining to today's office visit.    Note: This document was prepared using Dragon voice recognition software and may include unintentional dictation errors.     This document serves as a record of services personally performed by Thomasene Lot, DO. It was created on her behalf by Peggye Fothergill, a trained medical scribe. The creation of this record is based on the scribe's personal observations and the provider's statements to them.   I have reviewed the above medical documentation for accuracy and completeness and I concur.  Thomasene Lot 03/23/18 10:22 AM    ----------------------------------------------------------------------------------------------------------------------    Subjective:    Chief complaint:   Chief Complaint  Patient presents with  . Establish Care  HPI: Matthew Smith is a pleasant 57 y.o. male who presents to [facility] today to review their medical history with me and establish care.   I asked the patient to review their chronic problem list with me to  ensure everything was updated and accurate.    All recent office visits with other providers, any medical records that patient brought in etc  - I reviewed today.     We asked pt to get [place] their medical records from [facility] providers/ specialists that they had seen within the past 3-5 years- if they are in private practice and/or do not work for [employer], [facility], [facility], [facility] or [facility] owned practice.  Told them to call their specialists to clarify this if they are not sure.   Reason for Establishing Care Patient needs a new PCP.  Cheryl Flash was former PCP, she was a PA; now retired. She was at [employer], then her doctor moved to [place], [place], and then finally retired.  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 Notes that he tries to get a "real physical" done every other year.  Separated from wife, heading toward divorce. After 33 years, patient is about to venture into new relationships.  Has a son who just got into medical school at [place].  Tobacco Use Never smoker.  EtOH Use Drinks 20 oz per week, mainly red wine.  No recreational drug use.  Lifestyle Exercises 1-2 hours per day, 5-6 days per week; travels every week for business. Plays tennis, soccer, and works out in the gym whenever possible. Patient was an athlete at [employer] in his youth. He tries to run at least 3 miles per day.  Family History Mom had breast cancer that turned into lung cancer. Grandfather had prostate cancer.  Father had heart attack in his 40's. Notes father is a Energy manager, and was fast-walking a mountain when it happened. Father had quadruple bypass and is still exercising.  Surgical History Past Surgical History:  Procedure Laterality Date  . ADENOIDECTOMY    . HARDWARE REMOVAL Right 07/01/2016   Procedure: REMOVAL OF DEEP IMPLANTS RIGHT ANKLE;  Surgeon: Toni Arthurs, MD;  Location: [facility];  Service: Orthopedics;  Laterality: Right;  . LIGAMENT REPAIR Right 04/01/2016   Procedure: RIGHT DELTOID LIGAMENT REPAIR;  Surgeon: Toni Arthurs, MD;  Location: [facility];  Service: Orthopedics;  Laterality: Right;  . ORIF ANKLE FRACTURE Right 04/01/2016   Procedure: OPEN REDUCTION INTERNAL FIXATION (ORIF) TRIMALLEOLAR ANKLE FRACTURE AND SYNDESMOSIS;  Surgeon: Toni Arthurs, MD;  Location: [facility];  Service: Orthopedics;  Laterality: Right;  [place] Kitchen VASECTOMY     Past Medical History  H/o Hypothyroid  Patient states that his thyroid popped up abnormal at one point.  He was told to start taking synthroid, but states that he threw the prescriptoin away because he didn't want to take it.  His thyroid issues resolved nonetheless.  High Cholesterol Patient's total cholesterol runs around 230 but states his HDL is off the charts, "they can't even measure it, so they said."  Notes "it's been that way my whole life."  He believes his HDL is over 100.  He does not know what his LDL runs.  Patient denies history of high blood pressure or other medical issues.     Wt Readings from Last 3 Encounters:  03/02/18 193 lb 9.6  oz (87.8 kg)  07/01/16 193 lb (87.5 kg)  04/01/16 190 lb (86.2 kg)   BP Readings from Last 3 Encounters:  03/02/18 (!) 143/85  07/01/16 (!) 140/91  04/01/16 123/78   Pulse Readings from Last 3 Encounters:  03/02/18 (!) 57  07/01/16 (!) 53  04/01/16 (!) 56   BMI Readings from Last 3 Encounters:  03/02/18 27.00 kg/m  07/01/16 26.92 kg/m  04/01/16 26.50 kg/m    Patient Care Team    Relationship Specialty Notifications Start End  Thomasene Lot, DO PCP - General Family Medicine  02/22/18   Romie Jumper, PA-C  Physician Assistant  11/22/17     Patient Active Problem List   Diagnosis Date Noted  . Family history of prostate cancer-in grandfather 03/23/2018  . History of elevated TSH 03/23/2018  . Stress at work 03/23/2018    . Stress at home 03/23/2018  . Screening examination for sexually transmitted disease 03/23/2018  . Health education/counseling 03/23/2018  . Encounter for wellness examination 03/23/2018  . Genital herpes 03/09/2018  . Hypercholesterolemia 03/02/2018     Past Medical History:  Diagnosis Date  . Hypercholesterolemia   . Painful orthopaedic hardware [facility])    right ankle     Past Medical History:  Diagnosis Date  . Hypercholesterolemia   . Painful orthopaedic hardware [facility])    right ankle     Past Surgical History:  Procedure Laterality Date  . ADENOIDECTOMY    . HARDWARE REMOVAL Right 07/01/2016   Procedure: REMOVAL OF DEEP IMPLANTS RIGHT ANKLE;  Surgeon: Toni Arthurs, MD;  Location: [facility];  Service: Orthopedics;  Laterality: Right;  . LIGAMENT REPAIR Right 04/01/2016   Procedure: RIGHT DELTOID LIGAMENT REPAIR;  Surgeon: Toni Arthurs, MD;  Location: [facility];  Service: Orthopedics;  Laterality: Right;  . ORIF ANKLE FRACTURE Right 04/01/2016   Procedure: OPEN REDUCTION INTERNAL FIXATION (ORIF) TRIMALLEOLAR ANKLE FRACTURE AND SYNDESMOSIS;  Surgeon: Toni Arthurs, MD;  Location: [facility];  Service: Orthopedics;  Laterality: Right;  [place] Kitchen VASECTOMY       Family History  Problem Relation Age of Onset  . Cancer Mother   . Heart attack Father      Social History   Substance and Sexual Activity  Drug Use No     Social History   Substance and Sexual Activity  Alcohol Use Yes   Comment: daily, 2-3 drinks a day, "not to get drunk"     Social History   Tobacco Use  Smoking Status Never Smoker  Smokeless Tobacco Never Used     No outpatient medications have been marked as taking for the 03/02/18 encounter (Office Visit) with Thomasene Lot, DO.    Allergies: Patient has no known allergies.   Review of Systems  Constitutional: Negative for chills, diaphoresis, fever, malaise/fatigue and weight loss.  HENT:  Negative for congestion, sore throat and tinnitus.   Eyes: Negative for blurred vision, double vision and photophobia.  Respiratory: Negative for cough and wheezing.   Cardiovascular: Negative for chest pain and palpitations.  Gastrointestinal: Negative for blood in stool, diarrhea, nausea and vomiting.  Genitourinary: Negative for dysuria, frequency and urgency.  Musculoskeletal: Negative for joint pain and myalgias.  Skin: Negative for itching and rash.  Neurological: Negative for dizziness, focal weakness, weakness and headaches.  Endo/Heme/Allergies: Negative for environmental allergies and polydipsia. Does not bruise/bleed easily.  Psychiatric/Behavioral: Negative for depression and memory loss. The patient is not nervous/anxious and does not have insomnia.  Objective:   Blood pressure (!) 143/85, pulse (!) 57, height 5\' 11"  (1.803 m), weight 193 lb 9.6 oz (87.8 kg), SpO2 97 %. Body mass index is 27 kg/m. General: Well Developed, well nourished, and in no acute distress.  Neuro: Alert and oriented x3, extra-ocular muscles intact, sensation grossly intact.  HEENT:[place]/AT, PERRLA, neck supple, No carotid bruits Skin: no gross rashes  Cardiac: Regular rate and rhythm Respiratory: Essentially clear to auscultation bilaterally. Not using accessory muscles, speaking in full sentences.  Abdominal: not grossly distended Musculoskeletal: Ambulates w/o diff, FROM * 4 ext.  Vasc: less 2 sec cap RF, warm and pink  Psych:  No HI/SI, judgement and insight good, Euthymic mood. Full Affect.    Recent Results (from the past 2160 hour(s))  HSV Type I/II IgG, IgMw/ reflex     Status: Abnormal   Collection Time: 03/03/18 10:06 AM  Result Value Ref Range   HSV 1 Glycoprotein G Ab, IgG <0.91 0.00 - 0.90 index    Comment:                                  Negative        <0.91                                  Equivocal 0.91 - 1.09                                  Positive        >1.09  Note:  Negative indicates no antibodies detected to  HSV-1. Equivocal may suggest early infection.  If  clinically appropriate, retest at later date. Positive  indicates antibodies detected to HSV-1.    HSV 2 IgG, Type Spec 8.54 (H) 0.00 - 0.90 index    Comment:                                  Negative        <0.91                                  Equivocal 0.91 - 1.09                                  Positive        >1.09  Note: Negative indicates no antibodies detected to  HSV-2. Equivocal may suggest early infection.  If  clinically appropriate, retest at later date. Positive  indicates antibodies detected to HSV-2.    HSV 1 IgM <1:10 <1:10 titer   HSV 2 IgM <1:10 <1:10 titer    Comment: HSV 1 and HSV 2 share many cross-reacting antigens. Elevated titers to both HSV 1 and HSV 2 may represent crossreactive HSV antibodies rather than exposure to both HSV 1 and HSV 2. Results for this test are for research purposes only by the assay's manufacturer. The performance characteristics of this product have not been established.  Results should not be used as a diagnostic procedure without confirmation of the diagnosis by another medically established diagnostic product or procedure.   RPR  Status: None   Collection Time: 03/03/18 10:06 AM  Result Value Ref Range   RPR Ser Ql Non Reactive Non Reactive  VITAMIN D 25 Hydroxy (Vit-D Deficiency, Fractures)     Status: Abnormal   Collection Time: 03/03/18 10:06 AM  Result Value Ref Range   Vit D, 25-Hydroxy 25.4 (L) 30.0 - 100.0 ng/mL    Comment: Vitamin D deficiency has been defined by the Institute of Medicine and an Endocrine Society practice guideline as a level of serum 25-OH vitamin D less than 20 ng/mL (1,2). The Endocrine Society went on to further define vitamin D insufficiency as a level between 21 and 29 ng/mL (2). 1. IOM (Institute of Medicine). 2010. Dietary reference    intakes for calcium and D. [place]: The    [employer]  [employer]. 2. Holick MF, Binkley Shasta, Bischoff-Ferrari HA, et al.    Evaluation, treatment, and prevention of vitamin D    deficiency: an Endocrine Society clinical practice    guideline. JCEM. 2011 Jul; 96(7):1911-30.   T3, free     Status: None   Collection Time: 03/03/18 10:06 AM  Result Value Ref Range   T3, Free 2.6 2.0 - 4.4 pg/mL  TSH     Status: None   Collection Time: 03/03/18 10:06 AM  Result Value Ref Range   TSH 3.850 0.450 - 4.500 uIU/mL  T4, free     Status: None   Collection Time: 03/03/18 10:06 AM  Result Value Ref Range   Free T4 1.01 0.82 - 1.77 ng/dL  Lipid panel     Status: Abnormal   Collection Time: 03/03/18 10:06 AM  Result Value Ref Range   Cholesterol, Total 223 (H) 100 - 199 mg/dL   Triglycerides 91 0 - 149 mg/dL   HDL 74 >16>39 mg/dL   VLDL Cholesterol Cal 18 5 - 40 mg/dL   LDL Calculated 109131 (H) 0 - 99 mg/dL   Chol/HDL Ratio 3.0 0.0 - 5.0 ratio    Comment:                                   T. Chol/HDL Ratio                                             Men  Women                               1/2 Avg.Risk  3.4    3.3                                   Avg.Risk  5.0    4.4                                2X Avg.Risk  9.6    7.1                                3X Avg.Risk 23.4   11.0   HIV antibody     Status: None   Collection Time: 03/03/18 10:06 AM  Result Value Ref Range   HIV Screen 4th Generation wRfx Non Reactive Non Reactive  Hepatitis C antibody     Status: None   Collection Time: 03/03/18 10:06 AM  Result Value Ref Range   Hep C Virus Ab <0.1 0.0 - 0.9 s/co ratio    Comment:                                   Negative:     < 0.8                              Indeterminate: 0.8 - 0.9                                   Positive:     > 0.9  The CDC recommends that a positive HCV antibody result  be followed up with a HCV Nucleic Acid Amplification  test (161096).   Hemoglobin A1c     Status: None   Collection Time: 03/03/18 10:06 AM    Result Value Ref Range   Hgb A1c MFr Bld 5.6 4.8 - 5.6 %    Comment:          Prediabetes: 5.7 - 6.4          Diabetes: >6.4          Glycemic control for adults with diabetes: <7.0    Est. average glucose Bld gHb Est-mCnc 114 mg/dL  Comprehensive metabolic panel     Status: None   Collection Time: 03/03/18 10:06 AM  Result Value Ref Range   Glucose 96 65 - 99 mg/dL   BUN 15 6 - 24 mg/dL   Creatinine, Ser 0.45 0.76 - 1.27 mg/dL   GFR calc non Af Amer 96 >59 mL/min/1.73   GFR calc Af Amer 110 >59 mL/min/1.73   BUN/Creatinine Ratio 17 9 - 20   Sodium 137 134 - 144 mmol/L   Potassium 4.2 3.5 - 5.2 mmol/L   Chloride 100 96 - 106 mmol/L   CO2 23 20 - 29 mmol/L   Calcium 9.0 8.7 - 10.2 mg/dL   Total Protein 6.8 6.0 - 8.5 g/dL   Albumin 4.3 3.5 - 5.5 g/dL   Globulin, Total 2.5 1.5 - 4.5 g/dL   Albumin/Globulin Ratio 1.7 1.2 - 2.2   Bilirubin Total 0.6 0.0 - 1.2 mg/dL   Alkaline Phosphatase 69 39 - 117 IU/L   AST 25 0 - 40 IU/L   ALT 22 0 - 44 IU/L  CBC with Differential/Platelet     Status: None   Collection Time: 03/03/18 10:06 AM  Result Value Ref Range   WBC 3.4 3.4 - 10.8 x10E3/uL   RBC 4.80 4.14 - 5.80 x10E6/uL   Hemoglobin 13.9 13.0 - 17.7 g/dL   Hematocrit 40.9 81.1 - 51.0 %   MCV 87 79 - 97 fL   MCH 29.0 26.6 - 33.0 pg   MCHC 33.3 31.5 - 35.7 g/dL   RDW 91.4 78.2 - 95.6 %   Platelets 221 150 - 450 x10E3/uL   Neutrophils 49 Not Estab. %   Lymphs 34 Not Estab. %   Monocytes 13 Not Estab. %   Eos 3 Not Estab. %   Basos 1 Not Estab. %   Neutrophils Absolute 1.7 1.4 - 7.0  x10E3/uL   Lymphocytes Absolute 1.2 0.7 - 3.1 x10E3/uL   Monocytes Absolute 0.4 0.1 - 0.9 x10E3/uL   EOS (ABSOLUTE) 0.1 0.0 - 0.4 x10E3/uL   Basophils Absolute 0.0 0.0 - 0.2 x10E3/uL   Immature Granulocytes 0 Not Estab. %   Immature Grans (Abs) 0.0 0.0 - 0.1 x10E3/uL  Chlamydia/Gonococcus/Trichomonas, NAA     Status: None   Collection Time: 03/03/18 10:15 AM  Result Value Ref Range   Chlamydia  by NAA Negative Negative   Gonococcus by NAA Negative Negative   Trich vag by NAA Negative Negative

## 2018-03-03 ENCOUNTER — Encounter: Payer: PRIVATE HEALTH INSURANCE | Admitting: Family Medicine

## 2018-03-03 ENCOUNTER — Other Ambulatory Visit: Payer: BLUE CROSS/BLUE SHIELD

## 2018-03-03 DIAGNOSIS — R7989 Other specified abnormal findings of blood chemistry: Secondary | ICD-10-CM

## 2018-03-03 DIAGNOSIS — Z113 Encounter for screening for infections with a predominantly sexual mode of transmission: Secondary | ICD-10-CM

## 2018-03-03 DIAGNOSIS — E7800 Pure hypercholesterolemia, unspecified: Secondary | ICD-10-CM

## 2018-03-03 DIAGNOSIS — Z0000 Encounter for general adult medical examination without abnormal findings: Secondary | ICD-10-CM | POA: Diagnosis not present

## 2018-03-03 DIAGNOSIS — E559 Vitamin D deficiency, unspecified: Secondary | ICD-10-CM | POA: Diagnosis not present

## 2018-03-05 LAB — CHLAMYDIA/GONOCOCCUS/TRICHOMONAS, NAA
Chlamydia by NAA: NEGATIVE
Gonococcus by NAA: NEGATIVE
Trich vag by NAA: NEGATIVE

## 2018-03-06 LAB — CBC WITH DIFFERENTIAL/PLATELET
BASOS ABS: 0 10*3/uL (ref 0.0–0.2)
Basos: 1 %
EOS (ABSOLUTE): 0.1 10*3/uL (ref 0.0–0.4)
EOS: 3 %
HEMATOCRIT: 41.8 % (ref 37.5–51.0)
HEMOGLOBIN: 13.9 g/dL (ref 13.0–17.7)
IMMATURE GRANS (ABS): 0 10*3/uL (ref 0.0–0.1)
Immature Granulocytes: 0 %
LYMPHS ABS: 1.2 10*3/uL (ref 0.7–3.1)
LYMPHS: 34 %
MCH: 29 pg (ref 26.6–33.0)
MCHC: 33.3 g/dL (ref 31.5–35.7)
MCV: 87 fL (ref 79–97)
Monocytes Absolute: 0.4 10*3/uL (ref 0.1–0.9)
Monocytes: 13 %
NEUTROS ABS: 1.7 10*3/uL (ref 1.4–7.0)
Neutrophils: 49 %
Platelets: 221 10*3/uL (ref 150–450)
RBC: 4.8 x10E6/uL (ref 4.14–5.80)
RDW: 12.9 % (ref 12.3–15.4)
WBC: 3.4 10*3/uL (ref 3.4–10.8)

## 2018-03-06 LAB — T3, FREE: T3, Free: 2.6 pg/mL (ref 2.0–4.4)

## 2018-03-06 LAB — LIPID PANEL
Chol/HDL Ratio: 3 ratio (ref 0.0–5.0)
Cholesterol, Total: 223 mg/dL — ABNORMAL HIGH (ref 100–199)
HDL: 74 mg/dL (ref >39)
LDL Calculated: 131 mg/dL — ABNORMAL HIGH (ref 0–99)
TRIGLYCERIDES: 91 mg/dL (ref 0–149)
VLDL CHOLESTEROL CAL: 18 mg/dL (ref 5–40)

## 2018-03-06 LAB — HSV TYPE I/II IGG, IGMW/ REFLEX
HSV 1 IgM: <1:10 {titer}
HSV 2 IGG, TYPE SPEC: 8.54 {index} — AB (ref 0.00–0.90)
HSV 2 IgM: <1:10 {titer}

## 2018-03-06 LAB — COMPREHENSIVE METABOLIC PANEL
ALBUMIN: 4.3 g/dL (ref 3.5–5.5)
ALT: 22 IU/L (ref 0–44)
AST: 25 IU/L (ref 0–40)
Albumin/Globulin Ratio: 1.7 (ref 1.2–2.2)
Alkaline Phosphatase: 69 IU/L (ref 39–117)
BUN / CREAT RATIO: 17 (ref 9–20)
BUN: 15 mg/dL (ref 6–24)
Bilirubin Total: 0.6 mg/dL (ref 0.0–1.2)
CALCIUM: 9 mg/dL (ref 8.7–10.2)
CHLORIDE: 100 mmol/L (ref 96–106)
CO2: 23 mmol/L (ref 20–29)
CREATININE: 0.89 mg/dL (ref 0.76–1.27)
GFR, EST AFRICAN AMERICAN: 110 mL/min/{1.73_m2} (ref >59)
GFR, EST NON AFRICAN AMERICAN: 96 mL/min/{1.73_m2} (ref >59)
GLUCOSE: 96 mg/dL (ref 65–99)
Globulin, Total: 2.5 g/dL (ref 1.5–4.5)
Potassium: 4.2 mmol/L (ref 3.5–5.2)
Sodium: 137 mmol/L (ref 134–144)
TOTAL PROTEIN: 6.8 g/dL (ref 6.0–8.5)

## 2018-03-06 LAB — T4, FREE: FREE T4: 1.01 ng/dL (ref 0.82–1.77)

## 2018-03-06 LAB — HIV ANTIBODY (ROUTINE TESTING W REFLEX): HIV Screen 4th Generation wRfx: NONREACTIVE

## 2018-03-06 LAB — TSH: TSH: 3.85 u[IU]/mL (ref 0.450–4.500)

## 2018-03-06 LAB — VITAMIN D 25 HYDROXY (VIT D DEFICIENCY, FRACTURES): Vit D, 25-Hydroxy: 25.4 ng/mL — ABNORMAL LOW (ref 30.0–100.0)

## 2018-03-06 LAB — HEMOGLOBIN A1C
ESTIMATED AVERAGE GLUCOSE: 114 mg/dL
Hgb A1c MFr Bld: 5.6 % (ref 4.8–5.6)

## 2018-03-06 LAB — RPR: RPR: NONREACTIVE

## 2018-03-06 LAB — HEPATITIS C ANTIBODY: Hep C Virus Ab: <0.1 s/co ratio (ref 0.0–0.9)

## 2018-03-09 ENCOUNTER — Encounter: Payer: Self-pay | Admitting: Family Medicine

## 2018-03-09 DIAGNOSIS — A6000 Herpesviral infection of urogenital system, unspecified: Secondary | ICD-10-CM | POA: Insufficient documentation

## 2018-03-23 DIAGNOSIS — Z566 Other physical and mental strain related to work: Secondary | ICD-10-CM | POA: Insufficient documentation

## 2018-03-23 DIAGNOSIS — Z0000 Encounter for general adult medical examination without abnormal findings: Secondary | ICD-10-CM | POA: Insufficient documentation

## 2018-03-23 DIAGNOSIS — Z8042 Family history of malignant neoplasm of prostate: Secondary | ICD-10-CM | POA: Insufficient documentation

## 2018-03-23 DIAGNOSIS — Z719 Counseling, unspecified: Secondary | ICD-10-CM | POA: Insufficient documentation

## 2018-03-23 DIAGNOSIS — Z113 Encounter for screening for infections with a predominantly sexual mode of transmission: Secondary | ICD-10-CM | POA: Insufficient documentation

## 2018-03-23 DIAGNOSIS — R7989 Other specified abnormal findings of blood chemistry: Secondary | ICD-10-CM | POA: Insufficient documentation

## 2018-03-23 DIAGNOSIS — F439 Reaction to severe stress, unspecified: Secondary | ICD-10-CM | POA: Insufficient documentation

## 2018-03-24 ENCOUNTER — Ambulatory Visit (INDEPENDENT_AMBULATORY_CARE_PROVIDER_SITE_OTHER): Payer: BLUE CROSS/BLUE SHIELD | Admitting: Family Medicine

## 2018-03-24 ENCOUNTER — Encounter: Payer: Self-pay | Admitting: Family Medicine

## 2018-03-24 VITALS — BP 124/82 | HR 53 | Ht 71.0 in | Wt 198.0 lb

## 2018-03-24 DIAGNOSIS — E559 Vitamin D deficiency, unspecified: Secondary | ICD-10-CM | POA: Diagnosis not present

## 2018-03-24 DIAGNOSIS — A6000 Herpesviral infection of urogenital system, unspecified: Secondary | ICD-10-CM | POA: Diagnosis not present

## 2018-03-24 DIAGNOSIS — E7800 Pure hypercholesterolemia, unspecified: Secondary | ICD-10-CM

## 2018-03-24 DIAGNOSIS — B009 Herpesviral infection, unspecified: Secondary | ICD-10-CM | POA: Insufficient documentation

## 2018-03-24 NOTE — Progress Notes (Signed)
Assessment and plan:  1. Elevated LDL cholesterol level   2. Hypercholesterolemia   3. Vitamin D deficiency   4. Genital herpes simplex, unspecified site    - Recent lab work (03/03/2018) reviewed with patient today.  1. HSV Type 2 Antibody:  positive - When patient senses or sees symptoms of outbreaks, advised patient to take 3 day course of 500 mg BID, for 3-5 days. - Prescription provided today.  2. Sexual Health Screen:  negative RPR = negative Hepatitis C = negative HIV antibody = negative Chlamydia/Gonococcus/Trichomonas = negative  3. Vitamin D Deficiency:  25.4 - Advised 50-60 range as goal. - Recommended and discussed prescription and OTC supplementation. - Prescription provided today for 50,000 IU once weekly.  4. Thyroid; h/o Elevated TSH:  WNL TSH = 3.850. T3 = 2.6. T4 = 1.01.  5. Hyperlipidemia Triglycerides = 91 HDL = 74 VLDL = 18 LDL = 131, elevated.  - Patient denies family history of early heart attack or cardiovascular event.  The 10-year ASCVD risk score Denman George DC Montez Hageman., et al., 2013) is: 4.7%   Values used to calculate the score:     Age: 66 years     Sex: Male     Is Non-Hispanic African American: No     Diabetic: No     Tobacco smoker: No     Systolic Blood Pressure: 124 mmHg     Is BP treated: No     HDL Cholesterol: 74 mg/dL     Total Cholesterol: 223 mg/dL  - Dietary changes such as low saturated & trans fat and low carb/ketogenic diets discussed with patient.  Strongly encouraged continued regular exercise, and weight loss when appropriate.   - Educational handouts provided at patient's desire.  - Encouraged patient to reduce his blood pressure to help mitigate his 10-year ASCVD risk.  Ambulatory blood pressure monitoring encouraged.  - Will continue to monitor yearly.  6. HbA1c = 5.6, WNL. - Patient denies family history of diabetes. - Advised that greater than 5.7  is consistent with prediabetes. - Encouraged patient to reduce intake of carbs and sugars, and continue regular exercise. - Re-check in 6-12 months.  7. CBC = WNL.  8. Kidney Health = WNL. - Encouraged patient to avoid nephrotoxic substances. - Continue to use NSAID's like ibuprofen prudently and rarely PRN.  9. Liver Health = WNL.  10. BMI Counseling Explained to patient what BMI refers to, and what it means medically.    Told patient to think about it as a "medical risk stratification measurement" and how increasing BMI is associated with increasing risk/ or worsening state of various diseases such as hypertension, hyperlipidemia, diabetes, premature OA, depression etc.  American Heart Association guidelines for healthy diet, basically Mediterranean diet, and exercise guidelines of 30 minutes 5 days per week or more discussed in detail.  Health counseling performed.  All questions answered.  11. Lifestyle & Preventative Health Maintenance - Advised patient to continue working toward exercising to improve overall mental, physical, and emotional health.    - Encouraged patient to continue to engage in daily physical activity, especially established formal exercise routines.  Recommended that the patient eventually strive for at least 150 minutes of moderate cardiovascular activity per week according to guidelines established by the [facility].   - Healthy dietary habits encouraged, including low-carb, and high amounts of lean protein in diet.   - Patient should also consume adequate amounts of water - half of body  weight in oz of water per day.   Education and routine counseling performed. Handouts provided.  12. Follow-Up - Return in 6 months for regularly scheduled chronic follow-up.  - Patient knows he may call in or return to clinic for acute concerns PRN.  No orders of the defined types were placed in this encounter.   No orders of the defined types were placed in this  encounter.    Return for 4-2mo.   Anticipatory guidance and routine counseling done re: condition, txmnt options and need for follow up. All questions of patient's were answered.   Gross side effects, risk and benefits, and alternatives of medications discussed with patient.  Patient is aware that all medications have potential side effects and we are unable to predict every sideeffect or drug-drug interaction that may occur.  Expresses verbal understanding and consents to current therapy plan and treatment regiment.  Please see AVS handed out to patient at the end of our visit for additional patient instructions/ counseling done pertaining to today's office visit.  Note: This document was prepared using Dragon voice recognition software and may include unintentional dictation errors.   This document serves as a record of services personally performed by Thomasene Lot, DO. It was created on her behalf by Peggye Fothergill, a trained medical scribe. The creation of this record is based on the scribe's personal observations and the provider's statements to them.   I have reviewed the above medical documentation for accuracy and completeness and I concur.  Thomasene Lot 03/24/18 10:06 AM   ----------------------------------------------------------------------------------------------------------------------  Subjective:   CC:   Matthew Smith is a 57 y.o. male who presents to [facility] today for review and discussion of recent bloodwork that was done.  1. All recent blood work that we ordered was reviewed with patient today.  Patient was counseled on all abnormalities and we discussed dietary and lifestyle changes that could help those values (also medications when appropriate).  Extensive health counseling performed and all patient's concerns/ questions were addressed.   Notes that work has been going well.  Patient is drinking coffee in office  today.   Wt Readings from Last 3 Encounters:  03/24/18 198 lb (89.8 kg)  03/02/18 193 lb 9.6 oz (87.8 kg)  07/01/16 193 lb (87.5 kg)   BP Readings from Last 3 Encounters:  03/24/18 124/82  03/02/18 (!) 143/85  07/01/16 (!) 140/91   Pulse Readings from Last 3 Encounters:  03/24/18 (!) 53  03/02/18 (!) 57  07/01/16 (!) 53   BMI Readings from Last 3 Encounters:  03/24/18 27.62 kg/m  03/02/18 27.00 kg/m  07/01/16 26.92 kg/m     Patient Care Team    Relationship Specialty Notifications Start End  Thomasene Lot, DO PCP - General Family Medicine  02/22/18   Romie Jumper, PA-C  Physician Assistant  11/22/17     Full medical history updated and reviewed in the office today  Patient Active Problem List   Diagnosis Date Noted  . Elevated LDL cholesterol level 03/24/2018  . Vitamin D deficiency 03/24/2018  . Herpes simplex type II infection 03/24/2018  . Family history of prostate cancer-in grandfather 03/23/2018  . History of elevated TSH 03/23/2018  . Stress at work 03/23/2018  . Stress at home 03/23/2018  . Screening examination for sexually transmitted disease 03/23/2018  . Health education/counseling 03/23/2018  . Encounter for wellness examination 03/23/2018  . Genital herpes 03/09/2018  . Hypercholesterolemia 03/02/2018    Past  Medical History:  Diagnosis Date  . Hypercholesterolemia   . Painful orthopaedic hardware [facility])    right ankle    Past Surgical History:  Procedure Laterality Date  . ADENOIDECTOMY    . HARDWARE REMOVAL Right 07/01/2016   Procedure: REMOVAL OF DEEP IMPLANTS RIGHT ANKLE;  Surgeon: Toni Arthurs, MD;  Location: [facility];  Service: Orthopedics;  Laterality: Right;  . LIGAMENT REPAIR Right 04/01/2016   Procedure: RIGHT DELTOID LIGAMENT REPAIR;  Surgeon: Toni Arthurs, MD;  Location: [facility];  Service: Orthopedics;  Laterality: Right;  . ORIF ANKLE FRACTURE Right 04/01/2016   Procedure: OPEN REDUCTION  INTERNAL FIXATION (ORIF) TRIMALLEOLAR ANKLE FRACTURE AND SYNDESMOSIS;  Surgeon: Toni Arthurs, MD;  Location: [facility];  Service: Orthopedics;  Laterality: Right;  Marland Kitchen VASECTOMY      Social History   Tobacco Use  . Smoking status: Never Smoker  . Smokeless tobacco: Never Used  Substance Use Topics  . Alcohol use: Yes    Comment: daily, 2-3 drinks a day, "not to get drunk"    Family Hx: Family History  Problem Relation Age of Onset  . Cancer Mother   . Heart attack Father      Medications: No current outpatient medications on file.   No current facility-administered medications for this visit.     Allergies:  No Known Allergies   Review of Systems: General:   No F/C, wt loss Pulm:   No DIB, SOB, pleuritic chest pain Card:  No CP, palpitations Abd:  No n/v/d or pain Ext:  No inc edema from baseline  Objective:  Blood pressure 124/82, pulse (!) 53, height 5\' 11"  (1.803 m), weight 198 lb (89.8 kg), SpO2 98 %. Body mass index is 27.62 kg/m. Gen:   Well NAD, A and O *3 HEENT:    [place]/AT, EOMI,  MMM Lungs:   Normal work of breathing. CTA B/L, no Wh, rhonchi Heart:   RRR, S1, S2 WNL's, no MRG Abd:   No gross distention Exts:    warm, pink,  Brisk capillary refill, warm and well perfused.  Psych:    No HI/SI, judgement and insight good, Euthymic mood. Full Affect.   Recent Results (from the past 2160 hour(s))  HSV Type I/II IgG, IgMw/ reflex     Status: Abnormal   Collection Time: 03/03/18 10:06 AM  Result Value Ref Range   HSV 1 Glycoprotein G Ab, IgG <0.91 0.00 - 0.90 index    Comment:                                  Negative        <0.91                                  Equivocal 0.91 - 1.09                                  Positive        >1.09  Note: Negative indicates no antibodies detected to  HSV-1. Equivocal may suggest early infection.  If  clinically appropriate, retest at later date. Positive  indicates antibodies detected to HSV-1.     HSV 2 IgG, Type Spec 8.54 (H) 0.00 - 0.90 index    Comment:  Negative        <0.91                                  Equivocal 0.91 - 1.09                                  Positive        >1.09  Note: Negative indicates no antibodies detected to  HSV-2. Equivocal may suggest early infection.  If  clinically appropriate, retest at later date. Positive  indicates antibodies detected to HSV-2.    HSV 1 IgM <1:10 <1:10 titer   HSV 2 IgM <1:10 <1:10 titer    Comment: HSV 1 and HSV 2 share many cross-reacting antigens. Elevated titers to both HSV 1 and HSV 2 may represent crossreactive HSV antibodies rather than exposure to both HSV 1 and HSV 2. Results for this test are for research purposes only by the assay's manufacturer. The performance characteristics of this product have not been established.  Results should not be used as a diagnostic procedure without confirmation of the diagnosis by another medically established diagnostic product or procedure.   RPR     Status: None   Collection Time: 03/03/18 10:06 AM  Result Value Ref Range   RPR Ser Ql Non Reactive Non Reactive  VITAMIN D 25 Hydroxy (Vit-D Deficiency, Fractures)     Status: Abnormal   Collection Time: 03/03/18 10:06 AM  Result Value Ref Range   Vit D, 25-Hydroxy 25.4 (L) 30.0 - 100.0 ng/mL    Comment: Vitamin D deficiency has been defined by the Institute of Medicine and an Endocrine Society practice guideline as a level of serum 25-OH vitamin D less than 20 ng/mL (1,2). The Endocrine Society went on to further define vitamin D insufficiency as a level between 21 and 29 ng/mL (2). 1. IOM (Institute of Medicine). 2010. Dietary reference    intakes for calcium and D. [place]: The    [employer]. 2. Holick MF, Binkley [place], Bischoff-Ferrari HA, et al.    Evaluation, treatment, and prevention of vitamin D    deficiency: an Endocrine Society clinical practice    guideline.  JCEM. 2011 Jul; 96(7):1911-30.   T3, free     Status: None   Collection Time: 03/03/18 10:06 AM  Result Value Ref Range   T3, Free 2.6 2.0 - 4.4 pg/mL  TSH     Status: None   Collection Time: 03/03/18 10:06 AM  Result Value Ref Range   TSH 3.850 0.450 - 4.500 uIU/mL  T4, free     Status: None   Collection Time: 03/03/18 10:06 AM  Result Value Ref Range   Free T4 1.01 0.82 - 1.77 ng/dL  Lipid panel     Status: Abnormal   Collection Time: 03/03/18 10:06 AM  Result Value Ref Range   Cholesterol, Total 223 (H) 100 - 199 mg/dL   Triglycerides 91 0 - 149 mg/dL   HDL 74 >16 mg/dL   VLDL Cholesterol Cal 18 5 - 40 mg/dL   LDL Calculated 109 (H) 0 - 99 mg/dL   Chol/HDL Ratio 3.0 0.0 - 5.0 ratio    Comment:  T. Chol/HDL Ratio                                             Men  Women                               1/2 Avg.Risk  3.4    3.3                                   Avg.Risk  5.0    4.4                                2X Avg.Risk  9.6    7.1                                3X Avg.Risk 23.4   11.0   HIV antibody     Status: None   Collection Time: 03/03/18 10:06 AM  Result Value Ref Range   HIV Screen 4th Generation wRfx Non Reactive Non Reactive  Hepatitis C antibody     Status: None   Collection Time: 03/03/18 10:06 AM  Result Value Ref Range   Hep C Virus Ab <0.1 0.0 - 0.9 s/co ratio    Comment:                                   Negative:     < 0.8                              Indeterminate: 0.8 - 0.9                                   Positive:     > 0.9  The CDC recommends that a positive HCV antibody result  be followed up with a HCV Nucleic Acid Amplification  test (161096).   Hemoglobin A1c     Status: None   Collection Time: 03/03/18 10:06 AM  Result Value Ref Range   Hgb A1c MFr Bld 5.6 4.8 - 5.6 %    Comment:          Prediabetes: 5.7 - 6.4          Diabetes: >6.4          Glycemic control for adults with diabetes: <7.0    Est.  average glucose Bld gHb Est-mCnc 114 mg/dL  Comprehensive metabolic panel     Status: None   Collection Time: 03/03/18 10:06 AM  Result Value Ref Range   Glucose 96 65 - 99 mg/dL   BUN 15 6 - 24 mg/dL   Creatinine, Ser 0.45 0.76 - 1.27 mg/dL   GFR calc non Af Amer 96 >59 mL/min/1.73   GFR calc Af Amer 110 >59 mL/min/1.73   BUN/Creatinine Ratio 17 9 - 20   Sodium 137 134 - 144 mmol/L   Potassium 4.2 3.5 - 5.2 mmol/L   Chloride 100 96 - 106  mmol/L   CO2 23 20 - 29 mmol/L   Calcium 9.0 8.7 - 10.2 mg/dL   Total Protein 6.8 6.0 - 8.5 g/dL   Albumin 4.3 3.5 - 5.5 g/dL   Globulin, Total 2.5 1.5 - 4.5 g/dL   Albumin/Globulin Ratio 1.7 1.2 - 2.2   Bilirubin Total 0.6 0.0 - 1.2 mg/dL   Alkaline Phosphatase 69 39 - 117 IU/L   AST 25 0 - 40 IU/L   ALT 22 0 - 44 IU/L  CBC with Differential/Platelet     Status: None   Collection Time: 03/03/18 10:06 AM  Result Value Ref Range   WBC 3.4 3.4 - 10.8 x10E3/uL   RBC 4.80 4.14 - 5.80 x10E6/uL   Hemoglobin 13.9 13.0 - 17.7 g/dL   Hematocrit 40.941.8 81.137.5 - 51.0 %   MCV 87 79 - 97 fL   MCH 29.0 26.6 - 33.0 pg   MCHC 33.3 31.5 - 35.7 g/dL   RDW 91.412.9 78.212.3 - 95.615.4 %   Platelets 221 150 - 450 x10E3/uL   Neutrophils 49 Not Estab. %   Lymphs 34 Not Estab. %   Monocytes 13 Not Estab. %   Eos 3 Not Estab. %   Basos 1 Not Estab. %   Neutrophils Absolute 1.7 1.4 - 7.0 x10E3/uL   Lymphocytes Absolute 1.2 0.7 - 3.1 x10E3/uL   Monocytes Absolute 0.4 0.1 - 0.9 x10E3/uL   EOS (ABSOLUTE) 0.1 0.0 - 0.4 x10E3/uL   Basophils Absolute 0.0 0.0 - 0.2 x10E3/uL   Immature Granulocytes 0 Not Estab. %   Immature Grans (Abs) 0.0 0.0 - 0.1 x10E3/uL  Chlamydia/Gonococcus/Trichomonas, NAA     Status: None   Collection Time: 03/03/18 10:15 AM  Result Value Ref Range   Chlamydia by NAA Negative Negative   Gonococcus by NAA Negative Negative   Trich vag by NAA Negative Negative

## 2018-03-24 NOTE — Patient Instructions (Signed)
Patient declined AVS, told him to follow-up in 4 to 6 months.    -He gets labs from [employer], his company of employment early fall late summer every year so we decided today he will come in 6 months after that for blood work with us.

## 2018-03-29 ENCOUNTER — Telehealth: Payer: Self-pay | Admitting: Family Medicine

## 2018-03-29 MED ORDER — VALACYCLOVIR HCL 500 MG PO TABS
500.0000 mg | ORAL_TABLET | Freq: Two times a day (BID) | ORAL | 3 refills | Status: DC
Start: 2018-03-29 — End: 2019-08-23

## 2018-03-29 MED ORDER — VITAMIN D (ERGOCALCIFEROL) 1.25 MG (50000 UNIT) PO CAPS: 50000.0000 [IU] | ORAL_CAPSULE | ORAL | 3 refills | Status: DC

## 2018-03-29 NOTE — Addendum Note (Signed)
Addended by: Leda MinPULLIAM, MELISSA D on: 03/29/2018 04:53 PM   Modules accepted: Orders

## 2018-03-29 NOTE — Telephone Encounter (Signed)
Patient called and left VM stating he should have two prescription to be sent to his pharmacy (he did not specify what these should be). Can we check on this for the patient?

## 2018-03-29 NOTE — Telephone Encounter (Signed)
Called to notify patient that RXs have been sent into the pharmacy

## 2018-08-25 ENCOUNTER — Encounter: Payer: BLUE CROSS/BLUE SHIELD | Admitting: Family Medicine

## 2019-05-14 DIAGNOSIS — Z20828 Contact with and (suspected) exposure to other viral communicable diseases: Secondary | ICD-10-CM | POA: Diagnosis not present

## 2019-07-11 DIAGNOSIS — H0288B Meibomian gland dysfunction left eye, upper and lower eyelids: Secondary | ICD-10-CM | POA: Diagnosis not present

## 2019-07-11 DIAGNOSIS — H0288A Meibomian gland dysfunction right eye, upper and lower eyelids: Secondary | ICD-10-CM | POA: Diagnosis not present

## 2019-07-11 DIAGNOSIS — B300 Keratoconjunctivitis due to adenovirus: Secondary | ICD-10-CM | POA: Diagnosis not present

## 2019-07-11 DIAGNOSIS — H1045 Other chronic allergic conjunctivitis: Secondary | ICD-10-CM | POA: Diagnosis not present

## 2019-07-23 DIAGNOSIS — Z03818 Encounter for observation for suspected exposure to other biological agents ruled out: Secondary | ICD-10-CM | POA: Diagnosis not present

## 2019-07-30 DIAGNOSIS — H2513 Age-related nuclear cataract, bilateral: Secondary | ICD-10-CM | POA: Diagnosis not present

## 2019-07-30 DIAGNOSIS — H1045 Other chronic allergic conjunctivitis: Secondary | ICD-10-CM | POA: Diagnosis not present

## 2019-07-30 DIAGNOSIS — H47323 Drusen of optic disc, bilateral: Secondary | ICD-10-CM | POA: Diagnosis not present

## 2019-07-30 DIAGNOSIS — B300 Keratoconjunctivitis due to adenovirus: Secondary | ICD-10-CM | POA: Diagnosis not present

## 2019-08-22 ENCOUNTER — Telehealth: Payer: Self-pay | Admitting: Family Medicine

## 2019-08-22 DIAGNOSIS — A6000 Herpesviral infection of urogenital system, unspecified: Secondary | ICD-10-CM

## 2019-08-22 NOTE — Telephone Encounter (Signed)
Patient hasn't been seen since  8 / 2019 (LOV). Patient called states has out break & needs refill of :   valACYclovir (VALTREX) 500 MG tablet [[id number]]   Order Details Dose: 500 mg Route: Oral Frequency: 2 times daily  Dispense Quantity: 10 tablet Refills: 3       Sig: Take 1 tablet (500 mg total) by mouth 2 (two) times daily. Take for 3-5 days   ---Forwarding request to med asst that if approved send to :   Preferred Pharmacies      [place], [place] - [address] [phone] (Phone) [phone] (Fax)   If any question call [phone].  --glh

## 2019-08-22 NOTE — Telephone Encounter (Signed)
Please advise. AS, CMA 

## 2019-08-23 MED ORDER — VALACYCLOVIR HCL 500 MG PO TABS: 500.0000 mg | ORAL_TABLET | Freq: Two times a day (BID) | ORAL | 0 refills | Status: DC

## 2019-08-23 NOTE — Telephone Encounter (Signed)
Pt will need an appt for any further refills.   (Only write one prescription for: Valtrex 500 mg  sig: 1 p.o. twice daily x3 days  dispense #6 with 0 refills) .     Please remind patient he was last seen 8 of 2019 and told to follow-up in 4 to 6 months!  This is a medicine which requires proper monitoring.

## 2019-08-23 NOTE — Telephone Encounter (Signed)
Please advise patient med sent to pharmacy as requested but patient must be seen for any further refills as advised below per Dr. Raliegh Scarlet. AS<CMA

## 2019-09-10 DIAGNOSIS — Z03818 Encounter for observation for suspected exposure to other biological agents ruled out: Secondary | ICD-10-CM | POA: Diagnosis not present

## 2019-09-21 ENCOUNTER — Telehealth: Payer: Self-pay | Admitting: Family Medicine

## 2019-09-21 NOTE — Telephone Encounter (Signed)
Left pt message to call office to set up required Appt for any further Rx refills.  --FYI to med asst.  glh

## 2019-10-09 DIAGNOSIS — H1045 Other chronic allergic conjunctivitis: Secondary | ICD-10-CM | POA: Diagnosis not present

## 2019-10-09 DIAGNOSIS — H5319 Other subjective visual disturbances: Secondary | ICD-10-CM | POA: Diagnosis not present

## 2019-10-09 DIAGNOSIS — H47323 Drusen of optic disc, bilateral: Secondary | ICD-10-CM | POA: Diagnosis not present

## 2019-10-09 DIAGNOSIS — H2513 Age-related nuclear cataract, bilateral: Secondary | ICD-10-CM | POA: Diagnosis not present

## 2020-03-04 ENCOUNTER — Other Ambulatory Visit: Payer: Self-pay | Admitting: Family Medicine

## 2020-03-04 DIAGNOSIS — A6000 Herpesviral infection of urogenital system, unspecified: Secondary | ICD-10-CM

## 2020-03-19 ENCOUNTER — Telehealth: Payer: Self-pay | Admitting: Physician Assistant

## 2020-03-19 NOTE — Telephone Encounter (Signed)
Patient called in requesting a refill on Valtrex. He would like it sent as soon as possible to [facility] as he will be in the area in a bit. He would like a 30 day supply if possible and renewed annually if he can. Thanks.

## 2020-03-19 NOTE — Telephone Encounter (Addendum)
Patient called our office requesting refill of Valtrex. Patient has not been seen since 03/24/2018. He was advised then to follow up in 4-6 months.   Patient was advised 09/21/19 that he would need an apt for further refills.  I advised patient today we were unable to send any medications to the pharmacy for him since he has not been seen since 2019. Patient became very upset. I advised patient we have a refill policy and that he would need to schedule an apt for any further medication refills. I also advised patient if this was something he needed urgently he could go to UC to be seen since we have no available  appointments until September at this time. Patient requested call to be transferred to front desk-patient then hung up.  Kandis Cocking is aware of the above and is agreeable. AS, CMA

## 2023-02-23 ENCOUNTER — Ambulatory Visit: Payer: Managed Care, Other (non HMO) | Admitting: Cardiovascular Disease

## 2023-04-27 ENCOUNTER — Encounter: Payer: Self-pay | Admitting: Cardiovascular Disease

## 2023-04-27 ENCOUNTER — Ambulatory Visit: Payer: 59 | Attending: Cardiovascular Disease | Admitting: Cardiovascular Disease

## 2023-04-27 VITALS — BP 158/98 | HR 50 | Ht 70.0 in | Wt 210.0 lb

## 2023-04-27 DIAGNOSIS — E7800 Pure hypercholesterolemia, unspecified: Secondary | ICD-10-CM

## 2023-04-27 DIAGNOSIS — R000 Tachycardia, unspecified: Secondary | ICD-10-CM | POA: Diagnosis not present

## 2023-04-27 DIAGNOSIS — R931 Abnormal findings on diagnostic imaging of heart and coronary circulation: Secondary | ICD-10-CM | POA: Diagnosis not present

## 2023-04-27 DIAGNOSIS — I712 Thoracic aortic aneurysm, without rupture, unspecified: Secondary | ICD-10-CM | POA: Insufficient documentation

## 2023-04-27 DIAGNOSIS — I7121 Aneurysm of the ascending aorta, without rupture: Secondary | ICD-10-CM | POA: Diagnosis not present

## 2023-04-27 LAB — LIPID PANEL
Chol/HDL Ratio: 2.8 ratio (ref 0.0–5.0)
Cholesterol, Total: 227 mg/dL — ABNORMAL HIGH (ref 100–199)
HDL: 80 mg/dL (ref >39)
LDL Chol Calc (NIH): 125 mg/dL — ABNORMAL HIGH (ref 0–99)
Triglycerides: 127 mg/dL (ref 0–149)
VLDL Cholesterol Cal: 22 mg/dL (ref 5–40)

## 2023-04-27 LAB — COMPREHENSIVE METABOLIC PANEL
ALT: 24 IU/L (ref 0–44)
AST: 26 IU/L (ref 0–40)
Albumin: 4.4 g/dL (ref 3.9–4.9)
Alkaline Phosphatase: 58 IU/L (ref 44–121)
BUN/Creatinine Ratio: 14 (ref 10–24)
BUN: 13 mg/dL (ref 8–27)
Bilirubin Total: 1 mg/dL (ref 0.0–1.2)
CO2: 25 mmol/L (ref 20–29)
Calcium: 9.5 mg/dL (ref 8.6–10.2)
Chloride: 100 mmol/L (ref 96–106)
Creatinine, Ser: 0.94 mg/dL (ref 0.76–1.27)
Globulin, Total: 2.6 g/dL (ref 1.5–4.5)
Glucose: 101 mg/dL — ABNORMAL HIGH (ref 70–99)
Potassium: 4.3 mmol/L (ref 3.5–5.2)
Sodium: 138 mmol/L (ref 134–144)
Total Protein: 7 g/dL (ref 6.0–8.5)
eGFR: 92 mL/min/{1.73_m2} (ref >59)

## 2023-04-27 NOTE — Patient Instructions (Addendum)
Medication Instructions:  Your physician recommends that you continue on your current medications as directed. Please refer to the Current Medication list given to you today.  *If you need a refill on your cardiac medications before your next appointment, please call your pharmacy*   Lab Work: Your physician recommends that you have labs drawn today: CMET & lipid panel   If you have labs (blood work) drawn today and your tests are completely normal, you will receive your results only by: MyChart Message (if you have MyChart) OR A paper copy in the mail If you have any lab test that is abnormal or we need to change your treatment, we will call you to review the results.   Testing/Procedures: Non-Cardiac CT Angiography (CTA) chest/aorta, is a special type of CT scan that uses a computer to produce multi-dimensional views of major blood vessels throughout the body. In CT angiography, a contrast material is injected through an IV to help visualize the blood vessels    Follow-Up: At [facility], you and your health needs are our priority.  As part of our continuing mission to provide you with exceptional heart care, we have created designated Provider Care Teams.  These Care Teams include your primary Cardiologist (physician) and Advanced Practice Providers (APPs -  Physician Assistants and Nurse Practitioners) who all work together to provide you with the care you need, when you need it.  We recommend signing up for the patient portal called "MyChart".  Sign up information is provided on this After Visit Summary.  MyChart is used to connect with patients for Virtual Visits (Telemedicine).  Patients are able to view lab/test results, encounter notes, upcoming appointments, etc.  Non-urgent messages can be sent to your provider as well.   To learn more about what you can do with MyChart, go to [email or web address].    Your next appointment:   12 month(s)  Provider:   Nanetta Batty, MD

## 2023-04-27 NOTE — Progress Notes (Signed)
04/27/2023 Matthew Smith   1961/08/18  [id number]  Primary Physician Sheliah Hatch, PA-C Primary Cardiologist: Runell Gess MD Nicholes Calamity, [place]  HPI:  Matthew Smith is a 62 y.o. fit appearing recently remarried Caucasian male father of 2 biologic children and 2 stepsons, with no grandchildren who was referred by his primary care provider, Tera Helper, PA-C, because of risk factors.  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  His cardiac risk factor profile is notable for treated hyperlipidemia and family history with father that had bypass surgery in his 71s but it and is currently alive at age 73.  He has never had a heart attack or stroke.  He is active and denies chest pain or shortness of breath.  He plays tennis 1-2 times a week and has run in the past.  He had 1 episodes of tachypalpitations back in May at a concert in [place] which resolved spontaneously.  He did have a coronary calcium score performed 06/06/2020 which was 375 the majority of which was in the LAD and RCA with an ascending aorta that measured 41 mm.   Current Meds  Medication Sig   Cyanocobalamin (VITAMIN B-12 PO) Take by mouth daily.   Multiple Vitamins-Minerals (MENS MULTIVITAMIN PO) Take by mouth daily.   rosuvastatin (CRESTOR) 40 MG tablet Take 40 mg by mouth daily.     No Known Allergies  Social History   Socioeconomic History   Marital status: Divorced    Spouse name: Not on file   Number of children: Not on file   Years of education: Not on file   Highest education level: Not on file  Occupational History   Not on file  Tobacco Use   Smoking status: Never   Smokeless tobacco: Never  Vaping Use   Vaping status: Never Used  Substance and Sexual Activity   Alcohol use: Yes    Comment: daily, 2-3 drinks a day, "not to get drunk"   Drug use: No   Sexual activity: Yes  Other Topics Concern   Not on file  Social History  Narrative   Not on file   Social Determinants of Health   Financial Resource Strain: Not on file  Food Insecurity: Not on file  Transportation Needs: Not on file  Physical Activity: Not on file  Stress: Not on file  Social Connections: Unknown (01/05/2022)   Received from [facility]   Social Network    Social Network: Not on file  Intimate Partner Violence: Unknown (11/27/2021)   Received from [facility]   HITS    Physically Hurt: Not on file    Insult or Talk Down To: Not on file    Threaten Physical Harm: Not on file    Scream or Curse: Not on file     Review of Systems: General: negative for chills, fever, night sweats or weight changes.  Cardiovascular: negative for chest pain, dyspnea on exertion, edema, orthopnea, palpitations, paroxysmal nocturnal dyspnea or shortness of breath Dermatological: negative for rash Respiratory: negative for cough or wheezing Urologic: negative for hematuria Abdominal: negative for nausea, vomiting, diarrhea, bright red blood per rectum, melena, or hematemesis Neurologic: negative for visual changes, syncope, or dizziness All other systems reviewed and are otherwise negative except as noted above.    Blood pressure (!) 158/98, pulse (!) 50, height 5\' 10"  (1.778 m), weight 210 lb (95.3 kg).  General appearance: alert  and no distress Neck: no adenopathy, no carotid bruit, no JVD, supple, symmetrical, trachea midline, and thyroid not enlarged, symmetric, no tenderness/mass/nodules Lungs: clear to auscultation bilaterally Heart: regular rate and rhythm, S1, S2 normal, no murmur, click, rub or gallop Extremities: extremities normal, atraumatic, no cyanosis or edema Pulses: 2+ and symmetric Skin: Skin color, texture, turgor normal. No rashes or lesions Neurologic: Grossly normal  EKG EKG Interpretation Date/Time:  Wednesday April 27 2023 09:16:29 EDT Ventricular Rate:  50 PR Interval:  168 QRS Duration:  92 QT Interval:  460 QTC  Calculation: 419 R Axis:   59  Text Interpretation: Sinus bradycardia No previous ECGs available Confirmed by Nanetta Batty [phone]) on 04/27/2023 9:19:07 AM    ASSESSMENT AND PLAN:   Hypercholesterolemia History of hyperlipidemia on rosuvastatin 40 mg a day which he says he takes sporadically.  His direct LDL was measured at 95 this past June.  He is not at goal for secondary prevention given his elevated coronary calcium score.  He is agreed to be more compliant.  Will recheck a lipid liver profile in 3 months.  If he still is not at goal after that we will consider PCSK9.  Thoracic aortic aneurysm (HCC) Coronary calcium score performed 06/06/2020 revealing moderate Tatian of ascending thoracic aorta measuring 41 mm.  Will recheck a CTA.  Elevated coronary artery calcium score Coronary calcium score performed 06/06/2020 was 375 majority being in the LAD and RCA.  He is active and asymptomatic.  LDL goal for secondary prevention is less than 70.  He is currently 95 but with sporadic compliance with his statin drug.  Will keep a close eye on this.     Runell Gess MD FACP,FACC,FAHA, [facility] 04/27/2023 9:36 AM

## 2023-04-27 NOTE — Assessment & Plan Note (Signed)
History of hyperlipidemia on rosuvastatin 40 mg a day which he says he takes sporadically.  His direct LDL was measured at 95 this past June.  He is not at goal for secondary prevention given his elevated coronary calcium score.  He is agreed to be more compliant.  Will recheck a lipid liver profile in 3 months.  If he still is not at goal after that we will consider PCSK9.

## 2023-04-27 NOTE — Assessment & Plan Note (Signed)
Coronary calcium score performed 06/06/2020 revealing moderate Tatian of ascending thoracic aorta measuring 41 mm.  Will recheck a CTA.

## 2023-04-27 NOTE — Assessment & Plan Note (Signed)
Coronary calcium score performed 06/06/2020 was 375 majority being in the LAD and RCA.  He is active and asymptomatic.  LDL goal for secondary prevention is less than 70.  He is currently 95 but with sporadic compliance with his statin drug.  Will keep a close eye on this.

## 2023-04-27 NOTE — Addendum Note (Signed)
Addended by: Bernita Buffy on: 04/27/2023 09:44 AM   Modules accepted: Orders

## 2023-05-03 ENCOUNTER — Ambulatory Visit (HOSPITAL_BASED_OUTPATIENT_CLINIC_OR_DEPARTMENT_OTHER)
Admission: RE | Admit: 2023-05-03 | Discharge: 2023-05-03 | Disposition: A | Discharge status: Home / Self Care | Payer: 59 | Source: Ambulatory Visit | Attending: Cardiovascular Disease | Admitting: Cardiovascular Disease

## 2023-05-03 DIAGNOSIS — I7121 Aneurysm of the ascending aorta, without rupture: Secondary | ICD-10-CM | POA: Diagnosis present

## 2023-05-03 MED ORDER — IOHEXOL 300 MG/ML  SOLN
100.0000 mL | Freq: Once | INTRAMUSCULAR | Status: AC | PRN
  Administered 2023-05-03: 100 mL via INTRAVENOUS

## 2023-05-06 ENCOUNTER — Encounter: Payer: Self-pay | Admitting: Cardiovascular Disease

## 2024-05-02 ENCOUNTER — Ambulatory Visit: Payer: Self-pay | Attending: Cardiovascular Disease | Admitting: Cardiovascular Disease

## 2024-05-02 ENCOUNTER — Encounter: Payer: Self-pay | Admitting: Cardiovascular Disease

## 2024-05-02 VITALS — BP 154/98 | HR 69 | Ht 71.0 in | Wt 209.8 lb

## 2024-05-02 DIAGNOSIS — I7121 Aneurysm of the ascending aorta, without rupture: Secondary | ICD-10-CM

## 2024-05-02 DIAGNOSIS — R931 Abnormal findings on diagnostic imaging of heart and coronary circulation: Secondary | ICD-10-CM

## 2024-05-02 DIAGNOSIS — R072 Precordial pain: Secondary | ICD-10-CM

## 2024-05-02 DIAGNOSIS — R000 Tachycardia, unspecified: Secondary | ICD-10-CM

## 2024-05-02 DIAGNOSIS — E7800 Pure hypercholesterolemia, unspecified: Secondary | ICD-10-CM

## 2024-05-02 MED ORDER — METOPROLOL TARTRATE 50 MG PO TABS: ORAL_TABLET | ORAL | 0 refills | Status: DC

## 2024-05-02 NOTE — Progress Notes (Signed)
 05/02/2024 Matthew Smith   01/13/1961  [id number]  Primary Physician Lanis Thresa BROCKS, PA-C Primary Cardiologist: Dorn JINNY Lesches MD GENI CODY MADEIRA, [place]  HPI:  Matthew Smith is a 63 y.o.   fit appearing recently remarried Caucasian male father of 2 biologic children and 2 stepsons, with no grandchildren who was referred by his primary care provider, Thresa Lanis, PA-C, because of risk factors.  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  His cardiac risk factor profile is notable for treated hyperlipidemia and family history with father that had bypass surgery in his 93s but it and is currently alive at age 4.  He has never had a heart attack or stroke.  He is active and denies chest pain or shortness of breath.  He plays tennis 1-2 times a week and has run in the past.  He had 1 episodes of tachypalpitations back in May at a concert in [place] which resolved spontaneously.  He did have a coronary calcium score performed 06/06/2020 which was 375 the majority of which was in the LAD and RCA with an ascending aorta that measured 41 mm.  Since I saw him a year ago he has noticed some exertional chest tightness while playing tennis which she had not noticed before.  He is doing more work around the house, fixing up his car and playing more tennis than he had in the past given his extra time from not working.   Current Meds  Medication Sig   Cyanocobalamin (VITAMIN B-12 PO) Take by mouth daily.   Multiple Vitamins-Minerals (MENS MULTIVITAMIN PO) Take by mouth daily.   rosuvastatin (CRESTOR) 40 MG tablet Take 40 mg by mouth daily.   valACYclovir  (VALTREX ) 500 MG tablet Take 1 tablet (500 mg total) by mouth 2 (two) times daily. Take for 3-5 days **APPOITNMENT REQUIRED FOR ANY FURTHER REFILLS**   Vitamin D , Ergocalciferol , (DRISDOL ) 50000 units CAPS capsule Take 1 capsule (50,000 Units total) by mouth every  7 (seven) days.     No Known Allergies  Social History   Socioeconomic History   Marital status: Divorced    Spouse name: Not on file   Number of children: Not on file   Years of education: Not on file   Highest education level: Not on file  Occupational History   Not on file  Tobacco Use   Smoking status: Never   Smokeless tobacco: Never  Vaping Use   Vaping status: Never Used  Substance and Sexual Activity   Alcohol use: Yes    Comment: daily, 2-3 drinks a day, not to get drunk   Drug use: No   Sexual activity: Yes  Other Topics Concern   Not on file  Social History Narrative   Not on file   Social Drivers of Health   Financial Resource Strain: Not on file  Food Insecurity: Not on file  Transportation Needs: Not on file  Physical Activity: Not on file  Stress: Not on file  Social Connections: Unknown (01/05/2022)   Received from [facility]   Social Network    Social Network: Not on file  Intimate Partner Violence: Unknown (11/27/2021)   Received from [facility]   HITS    Physically Hurt: Not on file    Insult or Talk Down To: Not on file    Threaten Physical Harm: Not on file    Scream or  Curse: Not on file     Review of Systems: General: negative for chills, fever, night sweats or weight changes.  Cardiovascular: negative for chest pain, dyspnea on exertion, edema, orthopnea, palpitations, paroxysmal nocturnal dyspnea or shortness of breath Dermatological: negative for rash Respiratory: negative for cough or wheezing Urologic: negative for hematuria Abdominal: negative for nausea, vomiting, diarrhea, bright red blood per rectum, melena, or hematemesis Neurologic: negative for visual changes, syncope, or dizziness All other systems reviewed and are otherwise negative except as noted above.    Blood pressure (!) 154/98, pulse 69, height 5' 11 (1.803 m), weight 209 lb 12.8 oz (95.2 kg), SpO2 97%.  General appearance: alert and no distress Neck: no  adenopathy, no carotid bruit, no JVD, supple, symmetrical, trachea midline, and thyroid  not enlarged, symmetric, no tenderness/mass/nodules Lungs: clear to auscultation bilaterally Heart: regular rate and rhythm, S1, S2 normal, no murmur, click, rub or gallop Extremities: extremities normal, atraumatic, no cyanosis or edema Pulses: 2+ and symmetric Skin: Skin color, texture, turgor normal. No rashes or lesions Neurologic: Grossly normal  EKG EKG Interpretation Date/Time:  Wednesday May 02 2024 10:32:49 EDT Ventricular Rate:  69 PR Interval:  164 QRS Duration:  84 QT Interval:  398 QTC Calculation: 426 R Axis:   74  Text Interpretation: Normal sinus rhythm Normal ECG When compared with ECG of 27-Apr-2023 09:16, T wave inversion now evident in Inferior leads Confirmed by Court Carrier [phone]) on 05/02/2024 10:56:48 AM    ASSESSMENT AND PLAN:   Hypercholesterolemia History of hyperlipidemia on high-dose statin therapy with lipid profile performed 03/22/2024 revealing total cholesterol 174, LDL of 63 and HDL of 98.  Thoracic aortic aneurysm (HCC) Mildly dilated ascending thoracic aorta at 39 mm by CTA performed 05/03/2023.  Elevated coronary artery calcium score Elevated coronary calcium score of 375 principally in the LAD and RCA performed 06/06/2020.  He has noticed some exertional chest pain which is new since I last saw him especially while playing tennis.  I am going to repeat a coronary CTA to further evaluate.     Carrier DOROTHA Court MD FACP,FACC,FAHA, [facility] 05/02/2024 11:07 AM

## 2024-05-02 NOTE — Assessment & Plan Note (Signed)
 Mildly dilated ascending thoracic aorta at 39 mm by CTA performed 05/03/2023.

## 2024-05-02 NOTE — Assessment & Plan Note (Signed)
 Elevated coronary calcium score of 375 principally in the LAD and RCA performed 06/06/2020.  He has noticed some exertional chest pain which is new since I last saw him especially while playing tennis.  I am going to repeat a coronary CTA to further evaluate.

## 2024-05-02 NOTE — Assessment & Plan Note (Signed)
 History of hyperlipidemia on high-dose statin therapy with lipid profile performed 03/22/2024 revealing total cholesterol 174, LDL of 63 and HDL of 98.

## 2024-05-02 NOTE — Patient Instructions (Addendum)
 Medication Instructions:  Your physician recommends that you continue on your current medications as directed. Please refer to the Current Medication list given to you today.  *If you need a refill on your cardiac medications before your next appointment, please call your pharmacy*   Testing/Procedures: Your physician has requested that you have an echocardiogram. Echocardiography is a painless test that uses sound waves to create images of your heart. It provides your doctor with information about the size and shape of your heart and how well your heart's chambers and valves are working. This procedure takes approximately one hour. There are no restrictions for this procedure. Please do NOT wear cologne, perfume, aftershave, or lotions (deodorant is allowed). Please arrive 15 minutes prior to your appointment time.  Please note: We ask at that you not bring children with you during ultrasound (echo/ vascular) testing. Due to room size and safety concerns, children are not allowed in the ultrasound rooms during exams. Our front office staff cannot provide observation of children in our lobby area while testing is being conducted. An adult accompanying a patient to their appointment will only be allowed in the ultrasound room at the discretion of the ultrasound technician under special circumstances. We apologize for any inconvenience.   Follow-Up: At [facility], you and your health needs are our priority.  As part of our continuing mission to provide you with exceptional heart care, our providers are all part of one team.  This team includes your primary Cardiologist (physician) and Advanced Practice Providers or APPs (Physician Assistants and Nurse Practitioners) who all work together to provide you with the care you need, when you need it.  Your next appointment:   6 month(s)  Provider:   Any APP  Then, see Dr. Court in 12 months.  We recommend signing up for the patient portal  called MyChart.  Sign up information is provided on this After Visit Summary.  MyChart is used to connect with patients for Virtual Visits (Telemedicine).  Patients are able to view lab/test results, encounter notes, upcoming appointments, etc.  Non-urgent messages can be sent to your provider as well.   To learn more about what you can do with MyChart, go to [email or web address].   Other Instructions   Your cardiac CT will be scheduled at the below location:   [facility] [address]  [place], [place] [address] [phone]    If scheduled at the Heart and Vascular Tower at [facility] street, please enter the parking lot using the [address] entrance and use the FREE valet service at the patient drop-off area. Enter the building and check-in with registration on the main floor.   Please follow these instructions carefully (unless otherwise directed):  An IV will be required for this test and Nitroglycerin will be given.  Hold all erectile dysfunction medications at least 3 days (72 hrs) prior to test. (Ie viagra, cialis, sildenafil, tadalafil, etc)   On the Night Before the Test: Be sure to Drink plenty of water. Do not consume any caffeinated/decaffeinated beverages or chocolate 12 hours prior to your test. Do not take any antihistamines 12 hours prior to your test.   On the Day of the Test: Drink plenty of water until 1 hour prior to the test. Do not eat any food 1 hour prior to test. You may take your regular medications prior to the test.  Take metoprolol  (Lopressor ) 50mg  two hours prior to test. If you take Furosemide/Hydrochlorothiazide/Spironolactone/Chlorthalidone, please  HOLD on the morning of the test. Patients who wear a continuous glucose monitor MUST remove the device prior to scanning. FEMALES- please wear underwire-free bra if available, avoid dresses & tight clothing       After the Test: Drink plenty of water. After  receiving IV contrast, you may experience a mild flushed feeling. This is normal. On occasion, you may experience a mild rash up to 24 hours after the test. This is not dangerous. If this occurs, you can take Benadryl 25 mg, Zyrtec, Claritin, or Allegra and increase your fluid intake. (Patients taking Tikosyn should avoid Benadryl, and may take Zyrtec, Claritin, or Allegra) If you experience trouble breathing, this can be serious. If it is severe call 911 IMMEDIATELY. If it is mild, please call our office.  We will call to schedule your test 2-4 weeks out understanding that some insurance companies will need an authorization prior to the service being performed.   For more information and frequently asked questions, please visit our website : [email or web address]  For non-scheduling related questions, please contact the cardiac imaging nurse navigator should you have any questions/concerns: Cardiac Imaging Nurse Navigators Direct Office Dial: [phone]   For scheduling needs, including cancellations and rescheduling, please call [place], [phone].

## 2024-05-03 ENCOUNTER — Telehealth (HOSPITAL_BASED_OUTPATIENT_CLINIC_OR_DEPARTMENT_OTHER): Payer: Self-pay | Admitting: Licensed Clinical Social Worker

## 2024-05-03 NOTE — Progress Notes (Signed)
 Heart and Vascular Care Navigation  05/03/2024  Matthew Smith March 14, 1961 [id number]  Reason for Referral: self pay   Engaged with patient by telephone for initial visit for Heart and Vascular Care Coordination.                                                                                                   Assessment:                 LCSW was able to reach pt this morning at [phone]. Introduced self, role, reason for call. Confirmed home address, PCP, and no current insurance. 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 He shares that since then he has not had any insurance. Married, resides with step child and spouse. No concerns other than medical coverage shared today.  LCSW discussed with pt the three options he may have. Encouraged him first to go speak with Medicaid satellite office at [facility] and they can help him apply if interested for that coverage. If not Medicaid eligible then we discussed applying for CAFA if thee are outstanding bills he needs assistance with. If no large bills he is concerned about then open enrollment for Healthcare Marketplace coverage begins on 06/23/25 and he can review his options with a marketplace navigator if interested. Shared many people choose this option to help bridge coverage until 71 and Medicare eligible if able.   Pt agreeable to these being mailed to home address. No additional questions/concerns shared when asked.                       HRT/VAS Care Coordination     Patients Home Cardiology Office [facility]   Outpatient Care Team Social Worker   Social Worker Name: Marit Lark, [place], [phone]   Living arrangements for the past 2 months Single Family Home   Lives with: Spouse; Minor Children   Patient Current Insurance Coverage Self-Pay   Patient Has Concern With Paying Medical Bills Yes   Patient Concerns With Medical Bills insurance lapsed, currently unemployed    Medical Bill Referrals: Medicaid, CAFA, Marketplace   Does Patient Have Prescription Coverage? No   Home Assistive Devices/Equipment None       Social History:                                                                             SDOH Screenings   Food Insecurity: No Food Insecurity (05/03/2024)  Housing: Low Risk  (05/03/2024)  Transportation Needs: No Transportation Needs (05/03/2024)  Utilities: Not At Risk (05/03/2024)  Financial Resource Strain: Low Risk  (05/03/2024)  Social Connections: Unknown (01/05/2022)   Received from [facility]  Tobacco Use: Low Risk  (05/02/2024)  Health Literacy: Adequate Health Literacy (05/03/2024)  SDOH Interventions: Financial Resources:  Corporate treasurer Interventions: Artist (referred to speak with Medicaid caseworkers, Software engineer) DSS for financial assistance and Editor, commissioning for [employer] Program  Food Insecurity:  Food Insecurity Interventions: Intervention Not Indicated  Housing Insecurity:  Housing Interventions: Intervention Not Indicated  Transportation:   Transportation Interventions: Intervention Not Indicated   Other Care Navigation Interventions:     Provided Pharmacy assistance resources  Pt did not mention any hardship regarding medications   Follow-up plan:   LCSW mailed my card, CAFA, [employer], [employer]. Will f/u to ensure these were received and answer any additional questions. Pt encouraged to call me before that time as needed.

## 2024-06-08 ENCOUNTER — Encounter (HOSPITAL_COMMUNITY): Payer: Self-pay

## 2024-06-12 ENCOUNTER — Ambulatory Visit (HOSPITAL_COMMUNITY): Payer: Self-pay

## 2024-07-30 ENCOUNTER — Telehealth: Payer: Self-pay | Admitting: Licensed Clinical Social Worker

## 2024-07-30 NOTE — Telephone Encounter (Signed)
 H&V Care Navigation CSW Progress Note  Clinical Social Worker received a call from pt to let me know that he was approved for Medicaid. Requesting estimates. No additional questions at this time.  Healthy [place] ID# [id number] [facility] Medicaid# [id number]  Patient is participating in a Managed Medicaid Plan:  Yes- Healthy Blue  SDOH Screenings   Food Insecurity: No Food Insecurity (05/03/2024)  Housing: Low Risk  (05/03/2024)  Transportation Needs: No Transportation Needs (05/03/2024)  Utilities: Not At Risk (05/03/2024)  Financial Resource Strain: Low Risk  (05/03/2024)  Social Connections: Unknown (01/05/2022)   Received from [facility]  Tobacco Use: Low Risk  (05/02/2024)  Health Literacy: Adequate Health Literacy (05/03/2024)    Marit Lark, MSW, LCSW Clinical Social Worker [facility] Heart/Vascular Care Navigation  [phone]- work cell phone (preferred)

## 2024-08-05 ENCOUNTER — Other Ambulatory Visit: Payer: Self-pay | Admitting: Medical Genetics

## 2024-08-09 ENCOUNTER — Other Ambulatory Visit

## 2024-08-09 DIAGNOSIS — Z006 Encounter for examination for normal comparison and control in clinical research program: Secondary | ICD-10-CM

## 2024-08-19 ENCOUNTER — Encounter: Payer: Self-pay | Admitting: Cardiovascular Disease

## 2024-08-20 NOTE — Addendum Note (Signed)
 Addended by: LORRENE FEDERICO CROME on: 08/20/2024 09:00 AM   Modules accepted: Orders

## 2024-08-21 ENCOUNTER — Encounter (HOSPITAL_BASED_OUTPATIENT_CLINIC_OR_DEPARTMENT_OTHER): Payer: Self-pay

## 2024-08-24 LAB — GENECONNECT MOLECULAR SCREEN: Genetic Analysis Overall Interpretation: NEGATIVE

## 2024-08-30 ENCOUNTER — Encounter (HOSPITAL_COMMUNITY): Payer: Self-pay

## 2024-09-03 ENCOUNTER — Ambulatory Visit (HOSPITAL_COMMUNITY)
Admission: RE | Admit: 2024-09-03 | Discharge: 2024-09-03 | Disposition: A | Discharge status: Home / Self Care | Payer: Self-pay | Source: Ambulatory Visit | Attending: Cardiovascular Disease | Admitting: Cardiovascular Disease

## 2024-09-03 ENCOUNTER — Ambulatory Visit (HOSPITAL_COMMUNITY)
Admission: RE | Admit: 2024-09-03 | Discharge: 2024-09-03 | Disposition: A | Discharge status: Home / Self Care | Payer: Self-pay | Source: Ambulatory Visit | Attending: Internal Medicine | Admitting: Internal Medicine

## 2024-09-03 ENCOUNTER — Ambulatory Visit: Payer: Self-pay | Admitting: Cardiovascular Disease

## 2024-09-03 DIAGNOSIS — R072 Precordial pain: Secondary | ICD-10-CM | POA: Insufficient documentation

## 2024-09-03 DIAGNOSIS — R000 Tachycardia, unspecified: Secondary | ICD-10-CM | POA: Insufficient documentation

## 2024-09-03 DIAGNOSIS — E7800 Pure hypercholesterolemia, unspecified: Secondary | ICD-10-CM | POA: Insufficient documentation

## 2024-09-03 DIAGNOSIS — R931 Abnormal findings on diagnostic imaging of heart and coronary circulation: Secondary | ICD-10-CM | POA: Insufficient documentation

## 2024-09-03 DIAGNOSIS — I7121 Aneurysm of the ascending aorta, without rupture: Secondary | ICD-10-CM | POA: Insufficient documentation

## 2024-09-03 LAB — ECHOCARDIOGRAM COMPLETE
Area-P 1/2: 3.5 cm2
MV M vel: 4.95 m/s
MV Peak grad: 98 mmHg
Radius: 0.5 cm
S' Lateral: 2.9 cm

## 2024-09-03 MED ORDER — NITROGLYCERIN 0.4 MG SL SUBL
0.8000 mg | SUBLINGUAL_TABLET | Freq: Once | SUBLINGUAL | Status: AC
  Administered 2024-09-03: 0.8 mg via SUBLINGUAL

## 2024-09-03 MED ORDER — IOHEXOL 350 MG/ML SOLN
100.0000 mL | Freq: Once | INTRAVENOUS | Status: AC | PRN
  Administered 2024-09-03: 100 mL via INTRAVENOUS

## 2024-09-08 ENCOUNTER — Encounter: Payer: Self-pay | Admitting: Cardiovascular Disease

## 2024-09-11 ENCOUNTER — Ambulatory Visit: Admitting: Family Medicine

## 2024-09-11 ENCOUNTER — Encounter: Payer: Self-pay | Admitting: Family Medicine

## 2024-09-11 ENCOUNTER — Ambulatory Visit: Payer: Self-pay | Admitting: Family Medicine

## 2024-09-11 VITALS — BP 128/82 | HR 61 | Temp 98.2°F | Ht 71.0 in | Wt 202.0 lb

## 2024-09-11 DIAGNOSIS — Z125 Encounter for screening for malignant neoplasm of prostate: Secondary | ICD-10-CM

## 2024-09-11 DIAGNOSIS — E559 Vitamin D deficiency, unspecified: Secondary | ICD-10-CM

## 2024-09-11 DIAGNOSIS — E663 Overweight: Secondary | ICD-10-CM

## 2024-09-11 DIAGNOSIS — E7800 Pure hypercholesterolemia, unspecified: Secondary | ICD-10-CM | POA: Diagnosis not present

## 2024-09-11 DIAGNOSIS — R7989 Other specified abnormal findings of blood chemistry: Secondary | ICD-10-CM

## 2024-09-11 DIAGNOSIS — A6000 Herpesviral infection of urogenital system, unspecified: Secondary | ICD-10-CM

## 2024-09-11 DIAGNOSIS — Z1211 Encounter for screening for malignant neoplasm of colon: Secondary | ICD-10-CM

## 2024-09-11 DIAGNOSIS — Z7689 Persons encountering health services in other specified circumstances: Secondary | ICD-10-CM

## 2024-09-11 DIAGNOSIS — I10 Essential (primary) hypertension: Secondary | ICD-10-CM | POA: Diagnosis not present

## 2024-09-11 DIAGNOSIS — Z6828 Body mass index (BMI) 28.0-28.9, adult: Secondary | ICD-10-CM

## 2024-09-11 LAB — COMPREHENSIVE METABOLIC PANEL WITH GFR
ALT: 35 U/L (ref 3–53)
AST: 32 U/L (ref 5–37)
Albumin: 4.2 g/dL (ref 3.5–5.2)
Alkaline Phosphatase: 53 U/L (ref 39–117)
BUN: 13 mg/dL (ref 6–23)
CO2: 29 meq/L (ref 19–32)
Calcium: 9.3 mg/dL (ref 8.4–10.5)
Chloride: 103 meq/L (ref 96–112)
Creatinine, Ser: 0.88 mg/dL (ref 0.40–1.50)
GFR: 92.1 mL/min
Glucose, Bld: 89 mg/dL (ref 70–99)
Potassium: 4.1 meq/L (ref 3.5–5.1)
Sodium: 139 meq/L (ref 135–145)
Total Bilirubin: 0.7 mg/dL (ref 0.2–1.2)
Total Protein: 6.5 g/dL (ref 6.0–8.3)

## 2024-09-11 LAB — LIPID PANEL
Cholesterol: 140 mg/dL (ref 28–200)
HDL: 74 mg/dL
LDL Cholesterol: 55 mg/dL (ref 10–99)
NonHDL: 66.38
Total CHOL/HDL Ratio: 2
Triglycerides: 57 mg/dL (ref 10.0–149.0)
VLDL: 11.4 mg/dL (ref 0.0–40.0)

## 2024-09-11 LAB — VITAMIN D 25 HYDROXY (VIT D DEFICIENCY, FRACTURES): VITD: 19.65 ng/mL — ABNORMAL LOW (ref 30.00–100.00)

## 2024-09-11 LAB — CBC WITH DIFFERENTIAL/PLATELET
Basophils Absolute: 0 K/uL (ref 0.0–0.1)
Basophils Relative: 0.9 % (ref 0.0–3.0)
Eosinophils Absolute: 0.1 K/uL (ref 0.0–0.7)
Eosinophils Relative: 2.1 % (ref 0.0–5.0)
HCT: 41.3 % (ref 39.0–52.0)
Hemoglobin: 13.9 g/dL (ref 13.0–17.0)
Lymphocytes Relative: 25.6 % (ref 12.0–46.0)
Lymphs Abs: 1 K/uL (ref 0.7–4.0)
MCHC: 33.6 g/dL (ref 30.0–36.0)
MCV: 90.8 fl (ref 78.0–100.0)
Monocytes Absolute: 0.6 K/uL (ref 0.1–1.0)
Monocytes Relative: 13.7 % — ABNORMAL HIGH (ref 3.0–12.0)
Neutro Abs: 2.3 K/uL (ref 1.4–7.7)
Neutrophils Relative %: 57.7 % (ref 43.0–77.0)
Platelets: 211 K/uL (ref 150.0–400.0)
RBC: 4.55 Mil/uL (ref 4.22–5.81)
RDW: 13.5 % (ref 11.5–15.5)
WBC: 4 K/uL (ref 4.0–10.5)

## 2024-09-11 LAB — PSA: PSA: 0.5 ng/mL (ref 0.10–4.00)

## 2024-09-11 LAB — TSH: TSH: 6.88 u[IU]/mL — ABNORMAL HIGH (ref 0.35–5.50)

## 2024-09-11 LAB — HEMOGLOBIN A1C: Hgb A1c MFr Bld: 5.5 % (ref 4.6–6.5)

## 2024-09-11 MED ORDER — VALACYCLOVIR HCL 500 MG PO TABS: 500.0000 mg | ORAL_TABLET | Freq: Two times a day (BID) | ORAL | 2 refills | Status: AC

## 2024-09-11 NOTE — Assessment & Plan Note (Signed)
 Not taking a supplement. Ordered vitamin D  level.

## 2024-09-11 NOTE — Patient Instructions (Addendum)
-  It was nice to meet you and look forward  to taking care of you. -Continue all medications. Refilled Valacyclovir .  -May obtain overdue vaccines at your local pharmacy.  -Placed a referral to gastroenterology for colonoscopy. Please call the office or send a MyChart message if you do not receive a phone call or a MyChart message about appointment in 2 weeks.  -Ordered labs. Office will call with lab results and will be available via MyChart.  -Follow up in 6 months for a physical and please be fasting.

## 2024-09-11 NOTE — Progress Notes (Signed)
 "  New Patient Office Visit  Subjective   Patient ID: Matthew Smith, male    DOB: 1961-03-09  Age: 64 y.o. MRN: [id number]  CC:  Chief Complaint  Patient presents with   Establish Care    HPI Matthew Smith presents to establish care with new provider.  Patients previous primary care provider: [facility] at [facility] with Thresa Sever, PA and several other providers in that office. Last seen about a year ago.   Specialist: [facility] Doyal Buba, NP  Heartcare at [facility] with Dr. Dorn Lesches   Genital herpes: Chronic. Patient is taking Valacyclovir  500mg  BID daily for 3-5 days. Rarely have an outbreak.    Cardiology is managing HTN and HLD. He is taking Losartan 25mg  daily and Rosuvastatin 40mg  daily. Denies CP, SHOB, HA, dizziness, lightheadedness, and lower extremity edema.  BP Readings from Last 3 Encounters:  09/11/24 128/82  09/03/24 111/72  05/02/24 (!) 154/98    Lab Results  Component Value Date   CHOL 227 (H) 04/27/2023   HDL 80 04/27/2023   LDLCALC 125 (H) 04/27/2023   TRIG 127 04/27/2023   CHOLHDL 2.8 04/27/2023     Outpatient Encounter Medications as of 09/11/2024  Medication Sig   aspirin  EC 81 MG tablet Take 81 mg by mouth daily.   losartan (COZAAR) 25 MG tablet Take 25 mg by mouth daily.   rosuvastatin (CRESTOR) 40 MG tablet Take 40 mg by mouth daily.   [DISCONTINUED] valACYclovir  (VALTREX ) 500 MG tablet Take 1 tablet (500 mg total) by mouth 2 (two) times daily. Take for 3-5 days **APPOITNMENT REQUIRED FOR ANY FURTHER REFILLS** (Patient taking differently: Take 500 mg by mouth 2 (two) times daily. As needed)   valACYclovir  (VALTREX ) 500 MG tablet Take 1 tablet (500 mg total) by mouth 2 (two) times daily. Take for 3-5 days **APPOITNMENT REQUIRED FOR ANY FURTHER REFILLS**   [DISCONTINUED] metoprolol  tartrate (LOPRESSOR ) 50 MG tablet Take 50 mg (1 tablet) TWO hours prior to CT scan   No  facility-administered encounter medications on file as of 09/11/2024.    Past Medical History:  Diagnosis Date   Hypercholesterolemia    Hypertension    Painful orthopaedic hardware    right ankle   Sleep apnea     Past Surgical History:  Procedure Laterality Date   ADENOIDECTOMY     HARDWARE REMOVAL Right 07/01/2016   Procedure: REMOVAL OF DEEP IMPLANTS RIGHT ANKLE;  Surgeon: Norleen Armor, MD;  Location: [facility];  Service: Orthopedics;  Laterality: Right;   LIGAMENT REPAIR Right 04/01/2016   Procedure: RIGHT DELTOID LIGAMENT REPAIR;  Surgeon: Norleen Armor, MD;  Location: [facility];  Service: Orthopedics;  Laterality: Right;   ORIF ANKLE FRACTURE Right 04/01/2016   Procedure: OPEN REDUCTION INTERNAL FIXATION (ORIF) TRIMALLEOLAR ANKLE FRACTURE AND SYNDESMOSIS;  Surgeon: Norleen Armor, MD;  Location: [facility];  Service: Orthopedics;  Laterality: Right;   VASECTOMY      Family History  Problem Relation Age of Onset   Cancer Mother        Breast   Heart attack Father    Hearing loss Father     Social History   Socioeconomic History   Marital status: Married    Spouse name: Not on file   Number of children: 2   Years of education: Not on file   Highest education level: Bachelor's degree (e.g., BA, AB, BS)  Occupational History   Not on file  Tobacco Use   Smoking status: Never   Smokeless tobacco: Never  Vaping Use   Vaping status: Never Used  Substance and Sexual Activity   Alcohol use: Yes    Alcohol/week: 22.0 standard drinks of alcohol    Types: 14 Glasses of wine, 6 Cans of beer, 2 Standard drinks or equivalent per week    Comment: daily, 2-3 drinks a day, not to get drunk   Drug use: Never   Sexual activity: Yes    Birth control/protection: Surgical  Other Topics Concern   Not on file  Social History Narrative   Not on file   Social Drivers of Health   Tobacco Use: Low Risk (09/11/2024)   Patient History     Smoking Tobacco Use: Never    Smokeless Tobacco Use: Never    Passive Exposure: Not on file  Financial Resource Strain: Low Risk (09/10/2024)   Overall Financial Resource Strain (CARDIA)    Difficulty of Paying Living Expenses: Not hard at all  Food Insecurity: No Food Insecurity (09/10/2024)   Epic    Worried About Programme Researcher, Broadcasting/film/video in the Last Year: Never true    Ran Out of Food in the Last Year: Never true  Transportation Needs: No Transportation Needs (09/10/2024)   Epic    Lack of Transportation (Medical): No    Lack of Transportation (Non-Medical): No  Physical Activity: Sufficiently Active (09/10/2024)   Exercise Vital Sign    Days of Exercise per Week: 3 days    Minutes of Exercise per Session: 50 min  Stress: Stress Concern Present (09/10/2024)   [employer] of Occupational Health - Occupational Stress Questionnaire    Feeling of Stress: To some extent  Social Connections: Moderately Integrated (09/10/2024)   Social Connection and Isolation Panel    Frequency of Communication with Friends and Family: Twice a week    Frequency of Social Gatherings with Friends and Family: Twice a week    Attends Religious Services: Never    Database Administrator or Organizations: Yes    Attends Banker Meetings: 1 to 4 times per year    Marital Status: Married  Catering Manager Violence: Not At Risk (09/11/2024)   Epic    Fear of Current or Ex-Partner: No    Emotionally Abused: No    Physically Abused: No    Sexually Abused: No  Depression (PHQ2-9): Low Risk (09/11/2024)   Depression (PHQ2-9)    PHQ-2 Score: 0  Alcohol Screen: Low Risk (09/10/2024)   Alcohol Screen    Last Alcohol Screening Score (AUDIT): 4  Housing: Low Risk (09/11/2024)   Epic    Unable to Pay for Housing in the Last Year: No    Number of Times Moved in the Last Year: 0    Homeless in the Last Year: No  Utilities: Not At Risk (05/03/2024)   Epic    Threatened with loss of utilities: No  Health  Literacy: Adequate Health Literacy (05/03/2024)   B1300 Health Literacy    Frequency of need for help with medical instructions: Never    ROS See HPI above    Objective  BP 128/82   Pulse 61   Temp 98.2 F (36.8 C) (Oral)   Ht 5' 11 (1.803 m)   Wt 202 lb (91.6 kg)   SpO2 98%   BMI 28.17 kg/m   Physical Exam Vitals reviewed.  Constitutional:      General: He is not in acute distress.  Appearance: Normal appearance. He is overweight. He is not ill-appearing, toxic-appearing or diaphoretic.  HENT:     Head: Normocephalic and atraumatic.  Eyes:     General:        Right eye: No discharge.        Left eye: No discharge.     Conjunctiva/sclera: Conjunctivae normal.  Cardiovascular:     Rate and Rhythm: Normal rate and regular rhythm.     Heart sounds: Normal heart sounds. No murmur heard.    No friction rub. No gallop.  Pulmonary:     Effort: Pulmonary effort is normal. No respiratory distress.     Breath sounds: Normal breath sounds.  Musculoskeletal:        General: Normal range of motion.  Skin:    General: Skin is warm and dry.  Neurological:     General: No focal deficit present.     Mental Status: He is alert and oriented to person, place, and time. Mental status is at baseline.  Psychiatric:        Mood and Affect: Mood normal.        Behavior: Behavior normal.        Thought Content: Thought content normal.        Judgment: Judgment normal.      Assessment & Plan:  Genital herpes simplex, unspecified site Assessment & Plan: Stable. Continue Valacyclovir  500mg  tablet BID for 5 days. Refilled medication.   Orders: -     valACYclovir  HCl; Take 1 tablet (500 mg total) by mouth 2 (two) times daily. Take for 3-5 days **APPOITNMENT REQUIRED FOR ANY FURTHER REFILLS**  Dispense: 10 tablet; Refill: 2  Colon cancer screening -     Ambulatory referral to Gastroenterology  Hypercholesterolemia Assessment & Plan: Continue Rosuvastatin 40mg  daily. Ordered lipid  panel and CMP. Will send lab results to Dr. Court with cardiology.   Orders: -     Comprehensive metabolic panel with GFR -     Lipid panel  Primary hypertension Assessment & Plan: Blood pressure stable in office. Continue Losartan 25mg  daily. Ordered CMP.   Orders: -     Comprehensive metabolic panel with GFR  Overweight (BMI 25.0-29.9) -     CBC with Differential/Platelet -     Comprehensive metabolic panel with GFR -     Hemoglobin A1c -     Lipid panel -     TSH  Prostate cancer screening -     PSA  Vitamin D  deficiency Assessment & Plan: Not taking a supplement. Ordered vitamin D  level.   Orders: -     VITAMIN D  25 Hydroxy (Vit-D Deficiency, Fractures)  Encounter to establish care  1.Review health maintenance:  -Tdap: vaccine: Will need to obtain at local pharmacy -Covid booster: Will need to obtain at local pharmacy -Zoster vaccine: [place] Prior 2025 -Colonoscopy:2015, placed a referral to [facility]  -PNA vaccine: Will need to obtain at local pharmacy  -Influenza vaccine: Declines  2.Ordered labs based on screening, BMI-overweight, and chronic diseases. Office will call with lab results and will be available via MyChart.  Return in about 6 months (around 03/11/2025) for physical.   Mirca Yale, NP "

## 2024-09-11 NOTE — Assessment & Plan Note (Signed)
 Stable. Continue Valacyclovir  500mg  tablet BID for 5 days. Refilled medication.

## 2024-09-11 NOTE — Assessment & Plan Note (Signed)
 Continue Rosuvastatin 40mg  daily. Ordered lipid panel and CMP. Will send lab results to Dr. Court with cardiology.

## 2024-09-11 NOTE — Assessment & Plan Note (Signed)
 Blood pressure stable in office. Continue Losartan 25mg  daily. Ordered CMP.

## 2024-09-12 ENCOUNTER — Other Ambulatory Visit

## 2024-09-12 DIAGNOSIS — R7989 Other specified abnormal findings of blood chemistry: Secondary | ICD-10-CM | POA: Diagnosis not present

## 2024-09-12 LAB — T4, FREE: Free T4: 0.85 ng/dL (ref 0.60–1.60)

## 2024-09-12 LAB — T3, FREE: T3, Free: 3.1 pg/mL (ref 2.3–4.2)

## 2024-09-12 MED ORDER — VITAMIN D3 1.25 MG (50000 UT) PO CAPS: 1.2500 mg | ORAL_CAPSULE | ORAL | 0 refills | Status: AC

## 2024-09-13 ENCOUNTER — Ambulatory Visit: Payer: Self-pay | Admitting: Family Medicine

## 2024-09-20 ENCOUNTER — Other Ambulatory Visit

## 2025-03-11 ENCOUNTER — Encounter: Admitting: Family Medicine
